# Patient Record
Sex: Female | Born: 2000 | Race: White | Hispanic: No | Marital: Single | Smoking: Never smoker
Health system: Southern US, Community
[De-identification: ages and names within clinical notes are randomized; demographics above are authoritative.]

## PROBLEM LIST (undated history)

## (undated) DIAGNOSIS — R011 Cardiac murmur, unspecified: Secondary | ICD-10-CM

## (undated) DIAGNOSIS — S129XXA Fracture of neck, unspecified, initial encounter: Secondary | ICD-10-CM

## (undated) DIAGNOSIS — S43006A Unspecified dislocation of unspecified shoulder joint, initial encounter: Secondary | ICD-10-CM

## (undated) DIAGNOSIS — S72009A Fracture of unspecified part of neck of unspecified femur, initial encounter for closed fracture: Secondary | ICD-10-CM

## (undated) DIAGNOSIS — M419 Scoliosis, unspecified: Secondary | ICD-10-CM

## (undated) DIAGNOSIS — S62109A Fracture of unspecified carpal bone, unspecified wrist, initial encounter for closed fracture: Secondary | ICD-10-CM

## (undated) DIAGNOSIS — J302 Other seasonal allergic rhinitis: Secondary | ICD-10-CM

## (undated) HISTORY — PX: TONSILLECTOMY: SUR1361

## (undated) HISTORY — PX: KNEE SURGERY: SHX244

## (undated) HISTORY — PX: TONSILLECTOMY AND ADENOIDECTOMY: SUR1326

---
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
## 2018-01-20 DIAGNOSIS — D110 Benign neoplasm of parotid gland: Secondary | ICD-10-CM | POA: Insufficient documentation

## 2018-01-29 ENCOUNTER — Other Ambulatory Visit: Payer: Self-pay

## 2018-01-29 ENCOUNTER — Encounter (HOSPITAL_COMMUNITY): Payer: Self-pay | Admitting: Emergency Medicine

## 2018-01-29 ENCOUNTER — Ambulatory Visit (INDEPENDENT_AMBULATORY_CARE_PROVIDER_SITE_OTHER): Payer: BLUE CROSS/BLUE SHIELD

## 2018-01-29 ENCOUNTER — Ambulatory Visit (HOSPITAL_COMMUNITY)
Admission: EM | Admit: 2018-01-29 | Discharge: 2018-01-29 | Disposition: A | Discharge status: Home / Self Care | Payer: BLUE CROSS/BLUE SHIELD | Attending: Family Medicine | Admitting: Family Medicine

## 2018-01-29 DIAGNOSIS — M25511 Pain in right shoulder: Secondary | ICD-10-CM
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
## 2018-01-29 NOTE — Discharge Instructions (Addendum)
No fracture or dislocation on x-ray.  Leg contusion or muscular strain.  Use anti-inflammatories for pain/swelling. You may take up to 800 mg Ibuprofen every 8 hours with food. You may supplement Ibuprofen with Tylenol [phone] mg every 8 hours.   Please use sling for comfort, do not use all the time as your shoulder may get stiff  Please follow-up with your primary provider or orthopedics if symptoms not improving in 1-2 weeks.

## 2018-01-29 NOTE — ED Provider Notes (Signed)
[place]    CSN: [id number] Arrival date & time: 01/29/18  1033     History   Chief Complaint Chief Complaint  Patient presents with  . Shoulder Pain    HPI Jo Ward is a 17 y.o. female presenting today with right shoulder pain.  States that she was play fighting with her boyfriend on Monday-2 days ago and hit her right shoulder on the corner of the wood frame of a window.  She states that since she has developed a popping, clicking and tingling sensation.  Has also had pain limiting her movement.  She denies any neck pain or difficulty breathing.  She states that she is previously dislocated this shoulder about 4 years ago, but this does not feel like her dislocation.  Denies any loss of sensation distally.  HPI  Past Medical History:  Diagnosis Date  . Heart murmur   . Hip fracture ([place])   . Neck fracture ([place])   . Premature birth   . Seasonal allergies   . Shoulder dislocation   . Wrist fracture     Patient Active Problem List   Diagnosis Date Noted  . Whole body pain 10/22/2017  . Partial Achilles tendon tear, left, sequela 11/05/2016  . Posterior tibial tendonitis, left 11/05/2016  . Sprain of anterior talofibular ligament of left ankle 11/05/2016    Past Surgical History:  Procedure Laterality Date  . KNEE SURGERY     NPFL on left and right and meniscus repair to left knee (total of 3 knee surgeries) per mother and patient  . TONSILLECTOMY    . TONSILLECTOMY AND ADENOIDECTOMY      OB History    No data available       Home Medications    Prior to Admission medications   Medication Sig Start Date End Date Taking? Authorizing Provider  cyclobenzaprine (FLEXERIL) 5 MG tablet Take 1-2 tablets (5-10 mg total) 3 (three) times daily as needed by mouth for muscle spasms. 10/22/17  Yes Leandrew Koyanagi, MD  FLUoxetine (PROZAC) 20 MG capsule Take 20 mg by mouth daily.   Yes [provider]  loratadine (CLARITIN) 10 MG tablet Take 10 mg  by mouth daily.   Yes [provider]  naproxen (NAPROSYN) 500 MG tablet Take 1 tablet (500 mg total) 2 (two) times daily with a meal by mouth. 10/22/17  Yes Leandrew Koyanagi, MD  omeprazole (PRILOSEC) 20 MG capsule Take 20 mg by mouth daily.   Yes [provider]  acetaminophen (TYLENOL) 500 MG tablet Take 1,000 mg by mouth every 6 (six) hours as needed for moderate pain (pain).    [provider]  ibuprofen (ADVIL,MOTRIN) 200 MG tablet Take 400 mg by mouth every 6 (six) hours as needed for moderate pain (pain).    [provider]  omeprazole (PRILOSEC) 40 MG capsule Take 1 capsule (40 mg total) by mouth daily. 02/15/14   Gregor Hams, MD  ondansetron (ZOFRAN) 4 MG tablet Take 4 mg by mouth every 8 (eight) hours as needed for nausea or vomiting.    [provider]    Family History Family History  Problem Relation Age of Onset  . Hypertension Mother     Social History Social History   Tobacco Use  . Smoking status: Never Smoker  . Smokeless tobacco: Never Used  Substance Use Topics  . Alcohol use: No  . Drug use: No     Allergies   Lactose intolerance (gi)  Review of Systems Review of Systems  Respiratory: Negative for shortness of breath.   Cardiovascular: Negative for chest pain.  Gastrointestinal: Negative for nausea and vomiting.  Musculoskeletal: Positive for arthralgias and myalgias.  Skin: Negative for wound.  Neurological: Positive for weakness. Negative for dizziness, light-headedness, numbness and headaches.     Physical Exam Triage Vital Signs ED Triage Vitals  Enc Vitals Group     BP 01/29/18 1131 103/64     Pulse Rate 01/29/18 1131 79     Resp 01/29/18 1131 18     Temp 01/29/18 1131 97.7 F (36.5 C)     Temp src --      SpO2 01/29/18 1131 99 %     Weight --      Height --      Head Circumference --      Peak Flow --      Pain Score 01/29/18 1127 6     Pain Loc --      Pain Edu? --      Excl. in [place]? --      No data found.  Updated Vital Signs BP 103/64 (BP Location: Left Arm)   Pulse 79   Temp 97.7 F (36.5 C)   Resp 18   LMP 01/12/2018   SpO2 99%   Visual Acuity Right Eye Distance:   Left Eye Distance:   Bilateral Distance:    Right Eye Near:   Left Eye Near:    Bilateral Near:     Physical Exam  Constitutional: She appears well-developed and well-nourished. No distress.  HENT:  Head: Normocephalic and atraumatic.  Eyes: Conjunctivae are normal.  Neck: Neck supple.  Cardiovascular: Normal rate.  Pulmonary/Chest: Effort normal. No respiratory distress.  Musculoskeletal: She exhibits no edema.  Right shoulder: No obvious swelling or deformity.  Mild tenderness to palpation of superior aspect of clavicle, tenderness along lower cervical spine, tenderness along scapular spine and supraspinatus muscle.  Tenderness to deltoid and lateral aspect of proximal shoulder.  Pain elicited with lift off, [place], external rotation resistance, did not have any weakness compared to left.  Patient had full range of motion in all directions of the shoulder.  Radial pulse 2+, sensation intact distally on palmar and dorsal surfaces of hand.  Neurological: She is alert.  Skin: Skin is warm and dry.  Psychiatric: She has a normal mood and affect.  Nursing note and vitals reviewed.    UC Treatments / Results  Labs (all labs ordered are listed, but only abnormal results are displayed) Labs Reviewed - No data to display  EKG  EKG Interpretation None       Radiology Dg Shoulder Right  Result Date: 01/29/2018 CLINICAL DATA:  [place] on Monday landing on shoulder, struck the window sill, posterior RIGHT shoulder pain and pain at lateral border of RIGHT scapula radiating to the humeral head region laterally EXAM: RIGHT SHOULDER - 2+ VIEW COMPARISON:  None FINDINGS: Osseous mineralization normal. AC joint alignment normal. No acute fracture, dislocation, or bone destruction. Visualized RIGHT  ribs unremarkable. IMPRESSION: No acute osseous abnormalities. Electronically Signed   By: Lavonia Dana M.D.   On: 01/29/2018 12:34    Procedures Procedures (including critical care time)  Medications Ordered in UC Medications - No data to display   Initial Impression / Assessment and Plan / UC Course  I have reviewed the triage vital signs and the nursing notes.  Pertinent labs & imaging results that were available during my care of the  patient were reviewed by me and considered in my medical decision making (see chart for details).    Negative x-ray. Suspect either shoulder contusion versus muscular strain.  Patient did not have any weakness when testing the rotator cuff although she did have pain.  Will recommend conservative management, follow-up with pediatrician or orthopedics in 1-2 weeks if pain not improving.  NSAIDs, ice. Discussed strict return precautions. Patient verbalized understanding and is agreeable with plan.  Patient with previous compression of T1, endorses minimal neck pain pain is more so on the side of her neck.  Advised her to continue to monitor this for improvement.  Return if not resolving in about 1 week. Final Clinical Impressions(s) / UC Diagnoses   Final diagnoses:  Acute pain of right shoulder    ED Discharge Orders    None       Controlled Substance Prescriptions [place] Controlled Substance Registry consulted? Not Applicable   Janith Lima, [place] 01/29/18 1245

## 2018-01-30 ENCOUNTER — Ambulatory Visit (INDEPENDENT_AMBULATORY_CARE_PROVIDER_SITE_OTHER): Payer: BLUE CROSS/BLUE SHIELD | Admitting: Orthopaedic Surgery

## 2018-02-20 ENCOUNTER — Ambulatory Visit (INDEPENDENT_AMBULATORY_CARE_PROVIDER_SITE_OTHER): Payer: BLUE CROSS/BLUE SHIELD | Admitting: Psychology

## 2018-02-20 ENCOUNTER — Encounter: Payer: Self-pay | Admitting: Family Medicine

## 2018-02-20 DIAGNOSIS — F410 Panic disorder [episodic paroxysmal anxiety] without agoraphobia: Secondary | ICD-10-CM

## 2018-03-06 ENCOUNTER — Ambulatory Visit: Payer: BLUE CROSS/BLUE SHIELD | Admitting: Psychology

## 2018-03-20 ENCOUNTER — Ambulatory Visit: Payer: Self-pay | Admitting: Psychology

## 2018-03-20 DIAGNOSIS — R590 Localized enlarged lymph nodes: Secondary | ICD-10-CM | POA: Insufficient documentation

## 2018-04-03 ENCOUNTER — Ambulatory Visit: Payer: BLUE CROSS/BLUE SHIELD | Admitting: Psychology

## 2018-04-08 ENCOUNTER — Ambulatory Visit: Payer: BLUE CROSS/BLUE SHIELD | Admitting: Psychology

## 2018-04-28 ENCOUNTER — Ambulatory Visit
Admission: RE | Admit: 2018-04-28 | Discharge: 2018-04-28 | Disposition: A | Discharge status: Home / Self Care | Payer: Medicaid Other | Source: Ambulatory Visit | Attending: Family Medicine | Admitting: Family Medicine

## 2018-04-28 ENCOUNTER — Other Ambulatory Visit: Payer: Self-pay | Admitting: Family Medicine

## 2018-04-28 DIAGNOSIS — S0990XA Unspecified injury of head, initial encounter: Secondary | ICD-10-CM

## 2018-07-07 ENCOUNTER — Ambulatory Visit: Payer: BLUE CROSS/BLUE SHIELD | Admitting: Neurology

## 2018-07-09 ENCOUNTER — Encounter: Payer: Self-pay | Admitting: Neurology

## 2018-11-12 ENCOUNTER — Inpatient Hospital Stay (HOSPITAL_COMMUNITY)
Admission: AD | Admit: 2018-11-12 | Discharge: 2018-11-12 | Disposition: A | Discharge status: Home / Self Care | Payer: BLUE CROSS/BLUE SHIELD | Source: Ambulatory Visit | Attending: Obstetrics and Gynecology | Admitting: Obstetrics and Gynecology

## 2018-11-12 ENCOUNTER — Encounter (HOSPITAL_COMMUNITY): Payer: Self-pay | Admitting: *Deleted

## 2018-11-12 ENCOUNTER — Other Ambulatory Visit: Payer: Self-pay

## 2018-11-12 DIAGNOSIS — Z79899 Other long term (current) drug therapy: Secondary | ICD-10-CM | POA: Diagnosis not present

## 2018-11-12 DIAGNOSIS — R1032 Left lower quadrant pain: Secondary | ICD-10-CM | POA: Insufficient documentation

## 2018-11-12 DIAGNOSIS — Z791 Long term (current) use of non-steroidal anti-inflammatories (NSAID): Secondary | ICD-10-CM | POA: Insufficient documentation

## 2018-11-12 DIAGNOSIS — R102 Pelvic and perineal pain: Secondary | ICD-10-CM | POA: Diagnosis present

## 2018-11-12 LAB — URINALYSIS, ROUTINE W REFLEX MICROSCOPIC
Bilirubin Urine: NEGATIVE
Glucose, UA: NEGATIVE mg/dL
KETONES UR: 5 mg/dL — AB
Nitrite: NEGATIVE
PH: 5 (ref 5.0–8.0)
Protein, ur: NEGATIVE mg/dL
SPECIFIC GRAVITY, URINE: 1.015 (ref 1.005–1.030)

## 2018-11-12 LAB — CBC WITH DIFFERENTIAL/PLATELET
BASOS ABS: 0 10*3/uL (ref 0.0–0.1)
Basophils Relative: 0 %
EOS PCT: 2 %
Eosinophils Absolute: 0.2 10*3/uL (ref 0.0–0.5)
HCT: 43.9 % (ref 36.0–46.0)
HEMOGLOBIN: 14.8 g/dL (ref 12.0–15.0)
LYMPHS ABS: 2.8 10*3/uL (ref 0.7–4.0)
LYMPHS PCT: 30 %
MCH: 31.1 pg (ref 26.0–34.0)
MCHC: 33.7 g/dL (ref 30.0–36.0)
MCV: 92.2 fL (ref 80.0–100.0)
Monocytes Absolute: 0.3 10*3/uL (ref 0.1–1.0)
Monocytes Relative: 3 %
NEUTROS ABS: 5.9 10*3/uL (ref 1.7–7.7)
NEUTROS PCT: 65 %
NRBC: 0 % (ref 0.0–0.2)
PLATELETS: 222 10*3/uL (ref 150–400)
RBC: 4.76 MIL/uL (ref 3.87–5.11)
RDW: 12.3 % (ref 11.5–15.5)
WBC: 9.2 10*3/uL (ref 4.0–10.5)

## 2018-11-12 LAB — POCT PREGNANCY, URINE: Preg Test, Ur: NEGATIVE

## 2018-11-12 NOTE — MAU Provider Note (Signed)
History     CSN: [id number]  Arrival date and time: 11/12/18 1056   First Provider Initiated Contact with Patient 11/12/18 1150      Chief Complaint  Patient presents with  . Pelvic Pain   HPI Jo Ward is a 17 y.o. non pregnant female who presents with left lower quadrant pain. She states it started 4 days ago and is intermittent. She rates the pain a 7/10 and took an NSAID with relief. She denies any vaginal bleeding or discharge. She has no period because she is using Depo with her last injection around 8 weeks ago.   OB History   None     Past Medical History:  Diagnosis Date  . Heart murmur   . Hip fracture ([place])   . Neck fracture ([place])   . Premature birth   . Seasonal allergies   . Shoulder dislocation   . Wrist fracture     Past Surgical History:  Procedure Laterality Date  . KNEE SURGERY     NPFL on left and right and meniscus repair to left knee (total of 3 knee surgeries) per mother and patient  . TONSILLECTOMY    . TONSILLECTOMY AND ADENOIDECTOMY      Family History  Problem Relation Age of Onset  . Hypertension Mother     Social History   Tobacco Use  . Smoking status: Never Smoker  . Smokeless tobacco: Never Used  Substance Use Topics  . Alcohol use: No  . Drug use: No    Allergies:  Allergies  Allergen Reactions  . Lactose Intolerance (Gi) Other (See Comments)    REACTION:  GI upset    Medications Prior to Admission  Medication Sig Dispense Refill Last Dose  . acetaminophen (TYLENOL) 500 MG tablet Take 1,000 mg by mouth every 6 (six) hours as needed for moderate pain (pain).   Taking  . cyclobenzaprine (FLEXERIL) 5 MG tablet Take 1-2 tablets (5-10 mg total) 3 (three) times daily as needed by mouth for muscle spasms. 30 tablet 3 Past Month at Unknown time  . FLUoxetine (PROZAC) 20 MG capsule Take 20 mg by mouth daily.   01/29/2018 at Unknown time  . ibuprofen (ADVIL,MOTRIN) 200 MG tablet Take 400 mg by mouth every 6 (six) hours as  needed for moderate pain (pain).   Taking  . loratadine (CLARITIN) 10 MG tablet Take 10 mg by mouth daily.   01/29/2018 at Unknown time  . naproxen (NAPROSYN) 500 MG tablet Take 1 tablet (500 mg total) 2 (two) times daily with a meal by mouth. 30 tablet 3 Past Month at Unknown time  . omeprazole (PRILOSEC) 20 MG capsule Take 20 mg by mouth daily.   01/29/2018 at Unknown time  . omeprazole (PRILOSEC) 40 MG capsule Take 1 capsule (40 mg total) by mouth daily. 30 capsule 1 Taking  . ondansetron (ZOFRAN) 4 MG tablet Take 4 mg by mouth every 8 (eight) hours as needed for nausea or vomiting.   Taking    Review of Systems  Constitutional: Negative.  Negative for fatigue and fever.  HENT: Negative.   Respiratory: Negative.  Negative for shortness of breath.   Cardiovascular: Negative.  Negative for chest pain.  Gastrointestinal: Positive for abdominal pain. Negative for constipation, diarrhea, nausea and vomiting.  Genitourinary: Negative.  Negative for dysuria, vaginal bleeding and vaginal discharge.  Neurological: Negative.  Negative for dizziness and headaches.   Physical Exam   Blood pressure 121/75, pulse 96, temperature 98.5 F (36.9 C),  temperature source Oral, resp. rate 16, weight 57.6 kg, SpO2 100 %.  Physical Exam  Nursing note and vitals reviewed. Constitutional: She is oriented to person, place, and time. She appears well-developed and well-nourished. No distress.  HENT:  Head: Normocephalic.  Eyes: Pupils are equal, round, and reactive to light.  Cardiovascular: Normal rate, regular rhythm and normal heart sounds.  Respiratory: Effort normal and breath sounds normal. No respiratory distress.  GI: Soft. Bowel sounds are normal. She exhibits no distension. There is no tenderness.  Neurological: She is alert and oriented to person, place, and time.  Skin: Skin is warm and dry.  Psychiatric: She has a normal mood and affect. Her behavior is normal. Judgment and thought content  normal.    MAU Course  Procedures Results for orders placed or performed during the hospital encounter of 11/12/18 (from the past 24 hour(s))  Urinalysis, Routine w reflex microscopic     Status: Abnormal   Collection Time: 11/12/18 11:15 AM  Result Value Ref Range   Color, Urine YELLOW YELLOW   APPearance CLOUDY (A) CLEAR   Specific Gravity, Urine 1.015 1.005 - 1.030   pH 5.0 5.0 - 8.0   Glucose, UA NEGATIVE NEGATIVE mg/dL   Hgb urine dipstick SMALL (A) NEGATIVE   Bilirubin Urine NEGATIVE NEGATIVE   Ketones, ur 5 (A) NEGATIVE mg/dL   Protein, ur NEGATIVE NEGATIVE mg/dL   Nitrite NEGATIVE NEGATIVE   Leukocytes, UA SMALL (A) NEGATIVE   RBC / HPF 0-5 0 - 5 RBC/hpf   WBC, UA 0-5 0 - 5 WBC/hpf   Bacteria, UA RARE (A) NONE SEEN   Squamous Epithelial / LPF 21-50 0 - 5   Mucus PRESENT   Pregnancy, urine POC     Status: None   Collection Time: 11/12/18 11:19 AM  Result Value Ref Range   Preg Test, Ur NEGATIVE NEGATIVE  CBC with Differential/Platelet     Status: None   Collection Time: 11/12/18 12:15 PM  Result Value Ref Range   WBC 9.2 4.0 - 10.5 K/uL   RBC 4.76 3.87 - 5.11 MIL/uL   Hemoglobin 14.8 12.0 - 15.0 g/dL   HCT 43.9 36.0 - 46.0 %   MCV 92.2 80.0 - 100.0 fL   MCH 31.1 26.0 - 34.0 pg   MCHC 33.7 30.0 - 36.0 g/dL   RDW 12.3 11.5 - 15.5 %   Platelets 222 150 - 400 K/uL   nRBC 0.0 0.0 - 0.2 %   Neutrophils Relative % 65 %   Neutro Abs 5.9 1.7 - 7.7 K/uL   Lymphocytes Relative 30 %   Lymphs Abs 2.8 0.7 - 4.0 K/uL   Monocytes Relative 3 %   Monocytes Absolute 0.3 0.1 - 1.0 K/uL   Eosinophils Relative 2 %   Eosinophils Absolute 0.2 0.0 - 0.5 K/uL   Basophils Relative 0 %   Basophils Absolute 0.0 0.0 - 0.1 K/uL   MDM UA, UPT CBC with Diff VSS, no nausea, vomiting, diarrhea or constipation. Low suspicion for any acute process at this time. Patient refused pain medication and vaginal swabs  Patient and mother are asking to be evaluated for PCOS and endometriosis.  Explained to patient that this cannot be done in MAU and that we evaluate for emergent conditions. Explained that both of those require follow up in her gyn office.   Assessment and Plan   1. Left lower quadrant abdominal pain    -Discharge home in stable condition -Encouraged patient to use ibuprofen for  pain management -Patient advised to follow-up with [facility] for evaluation of gyn concerns -Patient may return to MAU as needed or if her condition were to change or worsen   Wende Mott CNM 11/12/2018, 11:51 AM

## 2018-11-12 NOTE — Discharge Instructions (Signed)
Endometriosis Endometriosis is a condition in which the tissue that lines the uterus (endometrium) grows outside of its normal location. The tissue may grow in many locations close to the uterus, but it commonly grows on the ovaries, fallopian tubes, vagina, or bowel. When the uterus sheds the endometrium every menstrual cycle, there is bleeding wherever the endometrial tissue is located. This can cause pain because blood is irritating to tissues that are not normally exposed to it. What are the causes? The cause of endometriosis is not known. What increases the risk? You may be more likely to develop endometriosis if you:  Have a family history of endometriosis.  Have never given birth.  Started your period at age 28 or younger.  Have high levels of estrogen in your body.  Were exposed to a certain medicine (diethylstilbestrol) before you were born (in utero).  Had low birth weight.  Were born as a twin, triplet, or other multiple.  Have a BMI of less than 25. BMI is an estimate of body fat and is calculated from height and weight.  What are the signs or symptoms? Often, there are no symptoms of this condition. If you do have symptoms, they may:  Vary depending on where your endometrial tissue is growing.  Occur during your menstrual period (most common) or midcycle.  Come and go, or you may go months with no symptoms at all.  Stop with menopause.  Symptoms may include:  Pain in the back or abdomen.  Heavier bleeding during periods.  Pain during sex.  Painful bowel movements.  Infertility.  Pelvic pain.  Bleeding more than once a month.  How is this diagnosed? This condition is diagnosed based on your symptoms and a physical exam. You may have tests, such as:  Blood tests and urine tests. These may be done to help rule out other possible causes of your symptoms.  Ultrasound, to look for abnormal tissues.  An X-ray of the lower bowel (barium enema).  An  ultrasound that is done through the vagina (transvaginally).  CT scan.  MRI.  Laparoscopy. In this procedure, a lighted, pencil-sized instrument called a laparoscope is inserted into your abdomen through an incision. The laparoscope allows your health care provider to look at the organs inside your body and check for abnormal tissue to confirm the diagnosis. If abnormal tissue is found, your health care provider may remove a small piece of tissue (biopsy) to be examined under a microscope.  How is this treated? Treatment for this condition may include:  Medicines to relieve pain, such as NSAIDs.  Hormone therapy. This involves using artificial (synthetic) hormones to reduce endometrial tissue growth. Your health care provider may recommend using a hormonal form of birth control, or other medicines.  Surgery. This may be done to remove abnormal endometrial tissue. ? In some cases, tissue may be removed using a laparoscope and a laser (laparoscopic laser treatment). ? In severe cases, surgery may be done to remove the fallopian tubes, uterus, and ovaries (hysterectomy).  Follow these instructions at home:  Take over-the-counter and prescription medicines only as told by your health care provider.  Do not drive or use heavy machinery while taking prescription pain medicine.  Try to avoid activities that cause pain, including sexual activity.  Keep all follow-up visits as told by your health care provider. This is important. Contact a health care provider if:  You have pain in the area between your hip bones (pelvic area) that occurs: ? Before, during, or  after your period. ? In between your period and gets worse during your period. ? During or after sex. ? With bowel movements or urination, especially during your period.  You have problems getting pregnant.  You have a fever. Get help right away if:  You have severe pain that does not get better with medicine.  You have severe  nausea and vomiting, or you cannot eat without vomiting.  You have pain that affects only the lower, right side of your abdomen.  You have abdominal pain that gets worse.  You have abdominal swelling.  You have blood in your stool. This information is not intended to replace advice given to you by your health care provider. Make sure you discuss any questions you have with your health care provider. Document Released: 11/30/2000 Document Revised: 09/07/2016 Document Reviewed: 05/05/2016 Elsevier Interactive Patient Education  2018 [place]. Polycystic Ovarian Syndrome Polycystic ovarian syndrome (PCOS) is a common hormonal disorder among women of reproductive age. In most women with PCOS, many small fluid-filled sacs (cysts) grow on the ovaries, and the cysts are not part of a normal menstrual cycle. PCOS can cause problems with your menstrual periods and make it difficult to get pregnant. It can also cause an increased risk of miscarriage with pregnancy. If it is not treated, PCOS can lead to serious health problems, such as diabetes and heart disease. What are the causes? The cause of PCOS is not known, but it may be the result of a combination of certain factors, such as:  Irregular menstrual cycle.  High levels of certain hormones (androgens).  Problems with the hormone that helps to control blood sugar (insulin resistance).  Certain genes.  What increases the risk? This condition is more likely to develop in women who have a family history of PCOS. What are the signs or symptoms? Symptoms of PCOS may include:  Multiple ovarian cysts.  Infrequent periods or no periods.  Periods that are too frequent or too heavy.  Unpredictable periods.  Inability to get pregnant (infertility) because of not ovulating.  Increased growth of hair on the face, chest, stomach, back, thumbs, thighs, or toes.  Acne or oily skin. Acne may develop during adulthood, and it may not respond to  treatment.  Pelvic pain.  Weight gain or obesity.  Patches of thickened and dark brown or black skin on the neck, arms, breasts, or thighs (acanthosis nigricans).  Excess hair growth on the face, chest, abdomen, or upper thighs (hirsutism).  How is this diagnosed? This condition is diagnosed based on:  Your medical history.  A physical exam, including a pelvic exam. Your health care provider may look for areas of increased hair growth on your skin.  Tests, such as: ? Ultrasound. This may be used to examine the ovaries and the lining of the uterus (endometrium) for cysts. ? Blood tests. These may be used to check levels of sugar (glucose), female hormone (testosterone), and female hormones (estrogen and progesterone) in your blood.  How is this treated? There is no cure for PCOS, but treatment can help to manage symptoms and prevent more health problems from developing. Treatment varies depending on:  Your symptoms.  Whether you want to have a baby or whether you need birth control (contraception).  Treatment may include nutrition and lifestyle changes along with:  Progesterone hormone to start a menstrual period.  Birth control pills to help you have regular menstrual periods.  Medicines to make you ovulate, if you want to get pregnant.  Medicine to reduce excessive hair growth.  Surgery, in severe cases. This may involve making small holes in one or both of your ovaries. This decreases the amount of testosterone that your body produces.  Follow these instructions at home:  Take over-the-counter and prescription medicines only as told by your health care provider.  Follow a healthy meal plan. This can help you reduce the effects of PCOS. ? Eat a healthy diet that includes lean proteins, complex carbohydrates, fresh fruits and vegetables, low-fat dairy products, and healthy fats. Make sure to eat enough fiber.  If you are overweight, lose weight as told by your health care  provider. ? Losing 10% of your body weight may improve symptoms. ? Your health care provider can determine how much weight loss is best for you and can help you lose weight safely.  Keep all follow-up visits as told by your health care provider. This is important. Contact a health care provider if:  Your symptoms do not get better with medicine.  You develop new symptoms. This information is not intended to replace advice given to you by your health care provider. Make sure you discuss any questions you have with your health care provider. Document Released: 03/29/2005 Document Revised: 07/31/2016 Document Reviewed: 05/20/2016 Elsevier Interactive Patient Education  [employer].

## 2018-11-12 NOTE — MAU Note (Signed)
Pain in left pelvic area, started about 4 days ago, has noted that left side of abd protrudes more then right.

## 2018-11-14 LAB — URINE CULTURE: Culture: >=100000 — AB

## 2018-12-15 ENCOUNTER — Ambulatory Visit (INDEPENDENT_AMBULATORY_CARE_PROVIDER_SITE_OTHER): Payer: BLUE CROSS/BLUE SHIELD | Admitting: Podiatry

## 2018-12-15 ENCOUNTER — Ambulatory Visit (INDEPENDENT_AMBULATORY_CARE_PROVIDER_SITE_OTHER): Payer: BLUE CROSS/BLUE SHIELD

## 2018-12-15 DIAGNOSIS — M25372 Other instability, left ankle: Secondary | ICD-10-CM | POA: Diagnosis not present

## 2018-12-15 DIAGNOSIS — M7662 Achilles tendinitis, left leg: Secondary | ICD-10-CM | POA: Diagnosis not present

## 2018-12-15 DIAGNOSIS — M79672 Pain in left foot: Secondary | ICD-10-CM

## 2018-12-15 DIAGNOSIS — M659 Synovitis and tenosynovitis, unspecified: Secondary | ICD-10-CM

## 2018-12-20 NOTE — Progress Notes (Signed)
   HPI: 18 year old female presents the office today as a new patient for evaluation of left ankle pain that is been going on for approximately 2 years.  Patient rolled her left ankle approximately 2 years ago and felt a sharp pulling sensation.  Patient complains that her left foot is now turning inside she is lost some sensation throughout the entire left foot resulting from nerve damage caused by left knee surgery in the past.  Patient continues to have chronic ankle pain for the past 2 years despite multiple conservative treatments including immobilization, steroid oral injections.  She presents for follow-up treatment evaluation  Past Medical History:  Diagnosis Date  . Heart murmur   . Hip fracture ([place])   . Neck fracture ([place])   . Premature birth   . Seasonal allergies   . Shoulder dislocation   . Wrist fracture      Physical Exam: General: The patient is alert and oriented x3 in no acute distress.  Dermatology: Skin is warm, dry and supple bilateral lower extremities. Negative for open lesions or macerations.  Vascular: Palpable pedal pulses bilaterally. No edema or erythema noted. Capillary refill within normal limits.  Neurological: Epicritic and protective threshold grossly intact bilaterally.   Musculoskeletal Exam: Range of motion within normal limits to all pedal and ankle joints bilateral. Muscle strength 5/5 in all groups bilateral.  Positive anterior drawer sign consistent with possible ATFL rupture.  There is also some peroneal subluxation with audible popping with dorsiflexion and eversion of the foot.  There is pain on palpation also along the Achilles tendon left lower extremity.  Radiographic Exam:  Normal osseous mineralization. Joint spaces preserved. No fracture/dislocation/boney destruction.    Assessment: 1.  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  Peroneal subluxation left 3.  Achilles tendinitis left   Plan of Care:  1. Patient evaluated. X-Rays  reviewed.  2.  Today were going to order an MRI left ankle to rule out all possible pathology.  Patient is had this pain for greater than 2 years secondary to an injury and conservative treatments have been unsuccessful for 2 years in providing any sort of lasting alleviation of symptoms 3.  Return to clinic in 4 weeks  *Patient is a Scientist, water quality at [employer]      Edrick Kins, DPM [facility]  Dr. Edrick Kins, DPM    [address], [place] [address]                Office [phone]  Fax [phone]

## 2018-12-22 ENCOUNTER — Telehealth: Payer: Self-pay | Admitting: *Deleted

## 2018-12-22 ENCOUNTER — Other Ambulatory Visit: Payer: Self-pay | Admitting: Podiatry

## 2018-12-22 DIAGNOSIS — M25372 Other instability, left ankle: Secondary | ICD-10-CM

## 2018-12-22 DIAGNOSIS — M7662 Achilles tendinitis, left leg: Secondary | ICD-10-CM

## 2018-12-22 NOTE — Telephone Encounter (Signed)
Orders to J. Quintana, RN for pre-cert and faxed to [facility]. 

## 2018-12-22 NOTE — Telephone Encounter (Signed)
-----   Message from Edrick Kins, DPM sent at 12/20/2018  1:47 PM EST ----- Regarding: MRI left ankle Please order MRI left ankle without contrast  Dx: Possible chronic ATFL rupture.  History of severe ankle sprain x2 years  Note dictated  Thanks, Dr. Amalia Hailey

## 2018-12-25 ENCOUNTER — Ambulatory Visit
Admission: RE | Admit: 2018-12-25 | Discharge: 2018-12-25 | Disposition: A | Discharge status: Home / Self Care | Payer: BLUE CROSS/BLUE SHIELD | Source: Ambulatory Visit | Attending: Podiatry | Admitting: Podiatry

## 2019-01-12 ENCOUNTER — Encounter: Payer: Self-pay | Admitting: Podiatry

## 2019-01-12 ENCOUNTER — Ambulatory Visit (INDEPENDENT_AMBULATORY_CARE_PROVIDER_SITE_OTHER): Payer: BLUE CROSS/BLUE SHIELD | Admitting: Podiatry

## 2019-01-12 DIAGNOSIS — S9305XD Dislocation of left ankle joint, subsequent encounter: Secondary | ICD-10-CM

## 2019-01-12 DIAGNOSIS — M659 Synovitis and tenosynovitis, unspecified: Secondary | ICD-10-CM

## 2019-01-12 DIAGNOSIS — IMO0001 Reserved for inherently not codable concepts without codable children: Secondary | ICD-10-CM

## 2019-01-18 NOTE — Progress Notes (Signed)
   HPI: 18 year old female presents to the office today for follow up evaluation of left foot and ankle pain. She reports continued pain that has not changed since her previous visit. Walking increases the pain. She has been using the ankle brace for treatment. Patient is here for further evaluation and treatment.   Past Medical History:  Diagnosis Date  . Heart murmur   . Hip fracture ([place])   . Neck fracture ([place])   . Premature birth   . Seasonal allergies   . Shoulder dislocation   . Wrist fracture      Physical Exam: General: The patient is alert and oriented x3 in no acute distress.  Dermatology: Skin is warm, dry and supple bilateral lower extremities. Negative for open lesions or macerations.  Vascular: Palpable pedal pulses bilaterally. No edema or erythema noted. Capillary refill within normal limits.  Neurological: Epicritic and protective threshold grossly intact bilaterally.   Musculoskeletal Exam: Range of motion within normal limits to all pedal and ankle joints bilateral. Muscle strength 5/5 in all groups bilateral.  Positive anterior drawer sign consistent with possible ATFL rupture.  There is also some peroneal subluxation with audible popping with dorsiflexion and eversion of the foot.  There is pain on palpation also along the Achilles tendon left lower extremity.  MRI Impression:  1. Subtle attenuation of the anterior talofibular ligament suggesting low-grade sprain or remote prior injury. No local synovitis to suggest anterolateral impingement. No complete discontinuity of the ATFL. 2. Mild subcortical marrow edema along the talar side of the posterior subtalar joint suggesting low-grade arthropathy. No fluid in the sinus tarsi. 3. Mildly accentuated distal tibialis posterior signal suggesting mild tendinopathy-correlate clinically in assessing for tibialis posterior dysfunction.  Assessment: 1.  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  Peroneal subluxation left 3.   Achilles tendinitis left   Plan of Care:  1. Patient evaluated. MRI reviewed.  2. Injection of 0.5 mLs Celestone Soluspan injected into the left ankle joint.  3. Continue using ankle brace.  4. Physical therapy ordered for twice weekly for 4 weeks.  5. Appointment with Liliane Channel, Pedorthist, for custom molded orthotics.  6. Return to clinic in 6 weeks.   *Patient is a Scientist, water quality at [employer]      Edrick Kins, DPM [facility]  Dr. Edrick Kins, DPM    [address], [place] [address]                Office [phone]  Fax [phone]

## 2019-01-27 ENCOUNTER — Other Ambulatory Visit: Payer: Self-pay | Admitting: Podiatry

## 2019-01-27 DIAGNOSIS — M659 Synovitis and tenosynovitis, unspecified: Secondary | ICD-10-CM

## 2019-02-02 ENCOUNTER — Ambulatory Visit (INDEPENDENT_AMBULATORY_CARE_PROVIDER_SITE_OTHER): Payer: BLUE CROSS/BLUE SHIELD | Admitting: Orthotics

## 2019-02-02 DIAGNOSIS — M659 Synovitis and tenosynovitis, unspecified: Secondary | ICD-10-CM

## 2019-02-02 DIAGNOSIS — IMO0001 Reserved for inherently not codable concepts without codable children: Secondary | ICD-10-CM

## 2019-02-02 DIAGNOSIS — M25372 Other instability, left ankle: Secondary | ICD-10-CM

## 2019-02-02 DIAGNOSIS — S9305XD Dislocation of left ankle joint, subsequent encounter: Secondary | ICD-10-CM

## 2019-02-02 NOTE — Progress Notes (Signed)
Patient came into today to be cast for Custom Foot Orthotics. Upon recommendation of Dr. Amalia Hailey  Patient presents with achilles tendonitis Left and syovitis; patient is also pes planus and RF everted. Goals are Rear foot stability Plan vendor [employer]

## 2019-02-23 ENCOUNTER — Encounter: Payer: BLUE CROSS/BLUE SHIELD | Admitting: Podiatry

## 2019-02-27 NOTE — Progress Notes (Signed)
This encounter was created in error - please disregard.

## 2019-03-07 ENCOUNTER — Ambulatory Visit
Admission: EM | Admit: 2019-03-07 | Discharge: 2019-03-07 | Disposition: A | Discharge status: Home / Self Care | Payer: BLUE CROSS/BLUE SHIELD | Attending: Physician Assistant | Admitting: Physician Assistant

## 2019-03-07 ENCOUNTER — Encounter: Payer: Self-pay | Admitting: Physician Assistant

## 2019-03-07 ENCOUNTER — Other Ambulatory Visit: Payer: Self-pay

## 2019-03-07 DIAGNOSIS — B349 Viral infection, unspecified: Secondary | ICD-10-CM

## 2019-03-07 MED ORDER — IPRATROPIUM BROMIDE 0.06 % NA SOLN: 2.0000 | Freq: Four times a day (QID) | NASAL | 12 refills | Status: DC

## 2019-03-07 MED ORDER — BENZONATATE 200 MG PO CAPS: 200.0000 mg | ORAL_CAPSULE | Freq: Three times a day (TID) | ORAL | 0 refills | Status: DC

## 2019-03-07 MED ORDER — FLUTICASONE PROPIONATE 50 MCG/ACT NA SUSP: 2.0000 | Freq: Every day | NASAL | 0 refills | Status: DC

## 2019-03-07 NOTE — Discharge Instructions (Signed)
Tessalon for cough. Start flonase, atrovent nasal spray for nasal congestion/drainage. You can use over the counter nasal saline rinse such as neti pot for nasal congestion. Keep hydrated, your urine should be clear to pale yellow in color. Tylenol/motrin for fever and pain. Monitor for any worsening of symptoms, chest pain, shortness of breath, wheezing, swelling of the throat, follow up for reevaluation.   For sore throat/cough try using a honey-based tea. Use 3 teaspoons of honey with juice squeezed from half lemon. Place shaved pieces of ginger into 1/2-1 cup of water and warm over stove top. Then mix the ingredients and repeat every 4 hours as needed.

## 2019-03-07 NOTE — ED Triage Notes (Signed)
Per pt one week ago pt was seen at dr and said negative for flu but with her symptoms was dx with the flu. Pt has  Been taking otc medication with no relief. Pt has said fever has subsided. No fever today. Cough, congestion, body aches, dark mucus.

## 2019-03-07 NOTE — ED Provider Notes (Signed)
[name]    CSN: [id number] Arrival date & time: 03/07/19  1054     History   Chief Complaint Chief Complaint  Patient presents with  . Cough  . Nasal Congestion    HPI Jo Ward is a 18 y.o. female.   18 year old female comes in for 1 week history of URI symptoms.  States symptoms started flulike with weakness, body aches, fever, cough, rhinorrhea, nasal congestion.  T-max 101, this resolved about 5 days ago.  At the time, she saw a provider, with negative flu testing, but patient states was told she most likely had the flu.  She was told to take over-the-counter medication, which she has continued DayQuil and NyQuil.  She states given symptoms has not improved, wanted to be evaluated to make sure she can go back to work.  She states she feels short of breath when she is coughing really hard, but otherwise no shortness of breath or wheezing.  No recent travels.  No obvious sick contact.      Past Medical History:  Diagnosis Date  . Heart murmur   . Hip fracture ([place])   . Neck fracture ([place])   . Premature birth   . Seasonal allergies   . Shoulder dislocation   . Wrist fracture     Patient Active Problem List   Diagnosis Date Noted  . Cervical lymphadenopathy 03/20/2018  . 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 Whole body pain 10/22/2017  . Partial Achilles tendon tear, left, sequela 11/05/2016  . Posterior tibial tendonitis, left 11/05/2016  . Sprain of anterior talofibular ligament of left ankle 11/05/2016  . Left knee pain 09/27/2015  . Other specified postprocedural states 03/24/2015  . 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 KNEE SURGERY     NPFL on left and right and meniscus repair to left knee (total of 3 knee surgeries) per mother and patient  . TONSILLECTOMY    . TONSILLECTOMY AND ADENOIDECTOMY      OB History   No obstetric history on file.      Home  Medications    Prior to Admission medications   Medication Sig Start Date End Date Taking? Authorizing Provider  acetaminophen (TYLENOL) 500 MG tablet Take 1,000 mg by mouth every 6 (six) hours as needed for moderate pain (pain).    [provider]  benzonatate (TESSALON) 200 MG capsule Take 1 capsule (200 mg total) by mouth every 8 (eight) hours. 03/07/19   Tasia Catchings, Callin Ashe V, PA-C  cyclobenzaprine (FLEXERIL) 5 MG tablet Take 1-2 tablets (5-10 mg total) 3 (three) times daily as needed by mouth for muscle spasms. 10/22/17   Leandrew Koyanagi, MD  FLUoxetine (PROZAC) 20 MG capsule Take 20 mg by mouth daily.    [provider]  fluticasone (FLONASE) 50 MCG/ACT nasal spray Place 2 sprays into both nostrils daily. 03/07/19   Tasia Catchings, Kamarius Buckbee V, PA-C  ibuprofen (ADVIL,MOTRIN) 200 MG tablet Take 400 mg by mouth every 6 (six) hours as needed for moderate pain (pain).    [provider]  ipratropium (ATROVENT) 0.06 % nasal spray Place 2 sprays into both nostrils 4 (four) times daily. 03/07/19   Tasia Catchings, Zitlaly Malson V, PA-C  loratadine (CLARITIN) 10 MG tablet Take 10 mg by mouth daily.    [provider]  medroxyPROGESTERone (DEPO-PROVERA) 150 MG/ML injection  02/10/18   [provider]  meloxicam (MOBIC) 7.5 MG tablet Take  by mouth. 09/22/18   [provider]  naproxen (NAPROSYN) 500 MG tablet Take 1 tablet (500 mg total) 2 (two) times daily with a meal by mouth. 10/22/17   Leandrew Koyanagi, MD  omeprazole (PRILOSEC) 20 MG capsule Take 20 mg by mouth daily.    [provider]  omeprazole (PRILOSEC) 40 MG capsule Take 1 capsule (40 mg total) by mouth daily. 02/15/14   Gregor Hams, MD  ondansetron (ZOFRAN) 4 MG tablet Take 4 mg by mouth every 8 (eight) hours as needed for nausea or vomiting.    [provider]  promethazine (PHENERGAN) 12.5 MG tablet  02/10/18   [provider]  traZODone (DESYREL) 50 MG tablet 1-3 tabs q hs 09/22/18   [provider]    Family  History Family History  Problem Relation Age of Onset  . Hypertension Mother     Social History Social History   Tobacco Use  . Smoking status: Never Smoker  . Smokeless tobacco: Never Used  Substance Use Topics  . Alcohol use: No  . Drug use: No     Allergies   Lactose intolerance (gi) and Other   Review of Systems Review of Systems  Reason unable to perform ROS: See HPI as above.     Physical Exam Triage Vital Signs ED Triage Vitals  Enc Vitals Group     BP 03/07/19 1111 117/80     Pulse Rate 03/07/19 1111 89     Resp 03/07/19 1111 16     Temp 03/07/19 1111 98 F (36.7 C)     Temp Source 03/07/19 1111 Oral     SpO2 03/07/19 1111 98 %     Weight 03/07/19 1110 130 lb (59 kg)     Height 03/07/19 1110 5' (1.524 m)     Head Circumference --      Peak Flow --      Pain Score 03/07/19 1110 5     Pain Loc --      Pain Edu? --      Excl. in [place]? --    No data found.  Updated Vital Signs BP 117/80 (BP Location: Right Arm)   Pulse 89   Temp 98 F (36.7 C) (Oral)   Resp 16   Ht 5' (1.524 m)   Wt 130 lb (59 kg)   SpO2 98%   BMI 25.39 kg/m   Physical Exam Constitutional:      General: She is not in acute distress.    Appearance: She is well-developed. She is not ill-appearing, toxic-appearing or diaphoretic.  HENT:     Head: Normocephalic and atraumatic.     Right Ear: Tympanic membrane, ear canal and external ear normal. Tympanic membrane is not erythematous or bulging.     Left Ear: Tympanic membrane, ear canal and external ear normal. Tympanic membrane is not erythematous or bulging.     Nose: Rhinorrhea present.     Right Sinus: No maxillary sinus tenderness or frontal sinus tenderness.     Left Sinus: No maxillary sinus tenderness or frontal sinus tenderness.     Mouth/Throat:     Mouth: Mucous membranes are moist.     Pharynx: Oropharynx is clear. Uvula midline.  Eyes:     Conjunctiva/sclera: Conjunctivae normal.     Pupils: Pupils are equal,  round, and reactive to light.  Neck:     Musculoskeletal: Normal range of motion and neck supple.  Cardiovascular:     Rate and Rhythm:  Normal rate and regular rhythm.     Heart sounds: Normal heart sounds. No murmur. No friction rub. No gallop.   Pulmonary:     Effort: Pulmonary effort is normal. No accessory muscle usage, prolonged expiration, respiratory distress or retractions.     Breath sounds: Normal breath sounds. No stridor, decreased air movement or transmitted upper airway sounds. No decreased breath sounds, wheezing, rhonchi or rales.  Skin:    General: Skin is warm and dry.  Neurological:     Mental Status: She is alert and oriented to person, place, and time.    UC Treatments / Results  Labs (all labs ordered are listed, but only abnormal results are displayed) Labs Reviewed - No data to display  EKG None  Radiology No results found.  Procedures Procedures (including critical care time)  Medications Ordered in UC Medications - No data to display  Initial Impression / Assessment and Plan / UC Course  I have reviewed the triage vital signs and the nursing notes.  Pertinent labs & imaging results that were available during my care of the patient were reviewed by me and considered in my medical decision making (see chart for details).    No alarming signs on exam. Lungs clear to auscultation without adventitious lung sounds. Discussed with patient cannot rule out COVID. Provided symptomatic treatment and education of isolation. Return precautions given. Patient expresses understanding and agrees to plan.  Final Clinical Impressions(s) / UC Diagnoses   Final diagnoses:  Viral illness   ED Prescriptions    Medication Sig Dispense Auth. Provider   fluticasone (FLONASE) 50 MCG/ACT nasal spray Place 2 sprays into both nostrils daily. 1 g Elvert Cumpton V, PA-C   ipratropium (ATROVENT) 0.06 % nasal spray Place 2 sprays into both nostrils 4 (four) times daily. 15 mL Geovannie Vilar,  Natasia Sanko V, PA-C   benzonatate (TESSALON) 200 MG capsule Take 1 capsule (200 mg total) by mouth every 8 (eight) hours. 21 capsule Tobin Chad, [place] 03/07/19 1229

## 2019-05-13 ENCOUNTER — Ambulatory Visit: Payer: BLUE CROSS/BLUE SHIELD | Admitting: Podiatry

## 2020-04-25 ENCOUNTER — Ambulatory Visit
Admission: EM | Admit: 2020-04-25 | Discharge: 2020-04-25 | Disposition: A | Discharge status: Home / Self Care | Payer: BLUE CROSS/BLUE SHIELD

## 2020-04-25 DIAGNOSIS — M545 Low back pain, unspecified: Secondary | ICD-10-CM

## 2020-04-25 HISTORY — DX: Scoliosis, unspecified: M41.9

## 2020-04-25 MED ORDER — TIZANIDINE HCL 2 MG PO TABS: 2.0000 mg | ORAL_TABLET | Freq: Three times a day (TID) | ORAL | 0 refills | Status: DC | PRN

## 2020-04-25 MED ORDER — DICLOFENAC SODIUM 50 MG PO TBEC: 50.0000 mg | DELAYED_RELEASE_TABLET | Freq: Two times a day (BID) | ORAL | 0 refills | Status: DC

## 2020-04-25 NOTE — Discharge Instructions (Signed)
Start diclofenac. Do not take ibuprofen (motrin/advil)/ naproxen (aleve) while on diclofenac. Tizanidine as needed, this can make you drowsy, so do not take if you are going to drive, operate heavy machinery, or make important decisions. Ice/heat compresses as needed. This can take up to 3-4 weeks to completely resolve, but you should be feeling better each week. Follow up with PCP/orthopedics if symptoms worsen, changes for reevaluation. If experience numbness/tingling of the inner thighs, loss of bladder or bowel control, go to the emergency department for evaluation.

## 2020-04-25 NOTE — ED Provider Notes (Signed)
[name]    CSN: [id number] Arrival date & time: 04/25/20  1703      History   Chief Complaint Chief Complaint  Patient presents with  . Back Pain    HPI Jo Ward is a 19 y.o. female.   19 year old female comes in for left back pain that started yesterday.  Denies injury or trauma.  Pain is to the left lower back, and can radiate to the posterior leg.  Worse with range of motion.  Denies saddle anesthesia, loss of bladder or bowel control.  Denies urinary changes.  Took gabapentin without relief.  Aleve to 20 mg with some relief.     Past Medical History:  Diagnosis Date  . Heart murmur   . Hip fracture ([place])   . Neck fracture ([place])   . Premature birth   . Scoliosis   . Seasonal allergies   . Shoulder dislocation   . Wrist fracture     Patient Active Problem List   Diagnosis Date Noted  . Cervical lymphadenopathy 03/20/2018  . 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 Whole body pain 10/22/2017  . Partial Achilles tendon tear, left, sequela 11/05/2016  . Posterior tibial tendonitis, left 11/05/2016  . Sprain of anterior talofibular ligament of left ankle 11/05/2016  . Left knee pain 09/27/2015  . Other specified postprocedural states 03/24/2015  . 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 KNEE SURGERY     NPFL on left and right and meniscus repair to left knee (total of 3 knee surgeries) per mother and patient  . TONSILLECTOMY    . TONSILLECTOMY AND ADENOIDECTOMY      OB History   No obstetric history on file.      Home Medications    Prior to Admission medications   Medication Sig Start Date End Date Taking? Authorizing Provider  gabapentin (NEURONTIN) 300 MG capsule Take 300 mg by mouth 3 (three) times daily.   Yes [provider]  acetaminophen (TYLENOL) 500 MG tablet Take 1,000 mg by mouth every 6 (six) hours as needed for moderate pain (pain).     [provider]  diclofenac (VOLTAREN) 50 MG EC tablet Take 1 tablet (50 mg total) by mouth 2 (two) times daily. 04/25/20   Tasia Catchings, Mahati Vajda V, PA-C  medroxyPROGESTERone (DEPO-PROVERA) 150 MG/ML injection  02/10/18   [provider]  tiZANidine (ZANAFLEX) 2 MG tablet Take 1 tablet (2 mg total) by mouth every 8 (eight) hours as needed for muscle spasms. 04/25/20   Ok Edwards, PA-C  promethazine (PHENERGAN) 12.5 MG tablet  02/10/18 04/25/20  [provider]    Family History Family History  Problem Relation Age of Onset  . Hypertension Mother     Social History Social History   Tobacco Use  . Smoking status: Never Smoker  . Smokeless tobacco: Never Used  Substance Use Topics  . Alcohol use: No  . Drug use: No     Allergies   Lactose intolerance (gi) and Other   Review of Systems Review of Systems  Reason unable to perform ROS: See HPI as above.     Physical Exam Triage Vital Signs ED Triage Vitals  Enc Vitals Group     BP 04/25/20 1716 121/72     Pulse Rate 04/25/20 1716 95     Resp 04/25/20 1716 16     Temp 04/25/20 1716 98.6 F (37 C)  Temp Source 04/25/20 1716 Oral     SpO2 04/25/20 1716 98 %     Weight --      Height --      Head Circumference --      Peak Flow --      Pain Score 04/25/20 1723 6     Pain Loc --      Pain Edu? --      Excl. in [place]? --    No data found.  Updated Vital Signs BP 121/72 (BP Location: Left Arm)   Pulse 95   Temp 98.6 F (37 C) (Oral)   Resp 16   SpO2 98%   Physical Exam Constitutional:      General: She is not in acute distress.    Appearance: She is well-developed. She is not diaphoretic.  HENT:     Head: Normocephalic and atraumatic.  Eyes:     Conjunctiva/sclera: Conjunctivae normal.     Pupils: Pupils are equal, round, and reactive to light.  Cardiovascular:     Rate and Rhythm: Normal rate and regular rhythm.     Heart sounds: Normal heart sounds. No murmur. No friction rub. No gallop.     Pulmonary:     Effort: Pulmonary effort is normal. No accessory muscle usage or respiratory distress.     Breath sounds: Normal breath sounds. No stridor. No decreased breath sounds, wheezing, rhonchi or rales.  Musculoskeletal:     Comments: No tenderness on palpation of the spinous processes. Tenderness to palpation of left lower back/hip. Decreased flexion of back. Decreased active ROM of hip. Full passive ROM of hip. Strength deferred due to pain. Sensation intact. Negative straight leg raise.  Skin:    General: Skin is warm and dry.  Neurological:     Mental Status: She is alert and oriented to person, place, and time.    UC Treatments / Results  Labs (all labs ordered are listed, but only abnormal results are displayed) Labs Reviewed - No data to display  EKG   Radiology No results found.  Procedures Procedures (including critical care time)  Medications Ordered in UC Medications - No data to display  Initial Impression / Assessment and Plan / UC Course  I have reviewed the triage vital signs and the nursing notes.  Pertinent labs & imaging results that were available during my care of the patient were reviewed by me and considered in my medical decision making (see chart for details).    Start NSAID as directed. Muscle relaxant as needed. Ice/heat compresses. Discussed with patient this can take up to 3-4 weeks to resolve, but should be getting better each week. Return precautions given.   Final Clinical Impressions(s) / UC Diagnoses   Final diagnoses:  Acute left-sided low back pain without sciatica    ED Prescriptions    Medication Sig Dispense Auth. Provider   diclofenac (VOLTAREN) 50 MG EC tablet Take 1 tablet (50 mg total) by mouth 2 (two) times daily. 20 tablet Marieme Mcmackin V, PA-C   tiZANidine (ZANAFLEX) 2 MG tablet Take 1 tablet (2 mg total) by mouth every 8 (eight) hours as needed for muscle spasms. 15 tablet Ok Edwards, PA-C     PDMP not reviewed this  encounter.   Ok Edwards, PA-C 04/25/20 1856

## 2020-04-25 NOTE — ED Triage Notes (Signed)
Pt c/o lower back pain radiating down lt leg since yesterday, denies injury.

## 2020-07-20 ENCOUNTER — Other Ambulatory Visit: Payer: Self-pay

## 2020-07-20 ENCOUNTER — Ambulatory Visit
Admission: EM | Admit: 2020-07-20 | Discharge: 2020-07-20 | Discharge status: Absent without leave | Payer: BC Managed Care – PPO

## 2020-07-20 NOTE — Discharge Instructions (Signed)
e

## 2020-08-07 ENCOUNTER — Other Ambulatory Visit: Payer: Self-pay

## 2020-08-07 ENCOUNTER — Encounter (HOSPITAL_COMMUNITY): Payer: Self-pay

## 2020-08-07 ENCOUNTER — Emergency Department (HOSPITAL_COMMUNITY)
Admission: EM | Admit: 2020-08-07 | Discharge: 2020-08-07 | Disposition: A | Discharge status: Home / Self Care | Payer: BC Managed Care – PPO | Attending: Emergency Medicine | Admitting: Emergency Medicine

## 2020-08-07 DIAGNOSIS — Z5321 Procedure and treatment not carried out due to patient leaving prior to being seen by health care provider: Secondary | ICD-10-CM | POA: Diagnosis not present

## 2020-08-07 DIAGNOSIS — R1032 Left lower quadrant pain: Secondary | ICD-10-CM | POA: Insufficient documentation

## 2020-08-07 LAB — COMPREHENSIVE METABOLIC PANEL
ALT: 21 U/L (ref 0–44)
AST: 23 U/L (ref 15–41)
Albumin: 4.2 g/dL (ref 3.5–5.0)
Alkaline Phosphatase: 67 U/L (ref 38–126)
Anion gap: 9 (ref 5–15)
BUN: 11 mg/dL (ref 6–20)
CO2: 21 mmol/L — ABNORMAL LOW (ref 22–32)
Calcium: 9.4 mg/dL (ref 8.9–10.3)
Chloride: 109 mmol/L (ref 98–111)
Creatinine, Ser: 0.79 mg/dL (ref 0.44–1.00)
GFR calc Af Amer: >60 mL/min (ref >60)
GFR calc non Af Amer: >60 mL/min (ref >60)
Glucose, Bld: 97 mg/dL (ref 70–99)
Potassium: 3.9 mmol/L (ref 3.5–5.1)
Sodium: 139 mmol/L (ref 135–145)
Total Bilirubin: 0.5 mg/dL (ref 0.3–1.2)
Total Protein: 7 g/dL (ref 6.5–8.1)

## 2020-08-07 LAB — URINALYSIS, ROUTINE W REFLEX MICROSCOPIC
Bacteria, UA: NONE SEEN
Bilirubin Urine: NEGATIVE
Glucose, UA: NEGATIVE mg/dL
Hgb urine dipstick: NEGATIVE
Ketones, ur: NEGATIVE mg/dL
Leukocytes,Ua: NEGATIVE
Nitrite: NEGATIVE
Protein, ur: 30 mg/dL — AB
Specific Gravity, Urine: 1.029 (ref 1.005–1.030)
pH: 5 (ref 5.0–8.0)

## 2020-08-07 LAB — CBC
HCT: 42.8 % (ref 36.0–46.0)
Hemoglobin: 14 g/dL (ref 12.0–15.0)
MCH: 30.1 pg (ref 26.0–34.0)
MCHC: 32.7 g/dL (ref 30.0–36.0)
MCV: 92 fL (ref 80.0–100.0)
Platelets: 212 10*3/uL (ref 150–400)
RBC: 4.65 MIL/uL (ref 3.87–5.11)
RDW: 12 % (ref 11.5–15.5)
WBC: 7.2 10*3/uL (ref 4.0–10.5)
nRBC: 0 % (ref 0.0–0.2)

## 2020-08-07 LAB — I-STAT BETA HCG BLOOD, ED (MC, WL, AP ONLY): I-stat hCG, quantitative: <5 m[IU]/mL (ref <5)

## 2020-08-07 LAB — LIPASE, BLOOD: Lipase: 25 U/L (ref 11–51)

## 2020-08-07 NOTE — ED Notes (Signed)
Pt stated she was leaving and going to urgent care °

## 2020-08-07 NOTE — ED Triage Notes (Signed)
Patient complains of left lower abdominal pain x 2 days, states that the pain is worse with ambulation. No hx of ovary issues but reports cyst runs in family, NAD

## 2021-01-08 ENCOUNTER — Other Ambulatory Visit: Payer: Self-pay

## 2021-01-08 ENCOUNTER — Ambulatory Visit
Admission: EM | Admit: 2021-01-08 | Discharge: 2021-01-08 | Disposition: A | Discharge status: Home / Self Care | Payer: BC Managed Care – PPO | Attending: Urgent Care | Admitting: Urgent Care

## 2021-01-08 ENCOUNTER — Encounter: Payer: Self-pay | Admitting: Emergency Medicine

## 2021-01-08 DIAGNOSIS — R059 Cough, unspecified: Secondary | ICD-10-CM

## 2021-01-08 DIAGNOSIS — B349 Viral infection, unspecified: Secondary | ICD-10-CM

## 2021-01-08 DIAGNOSIS — Z20822 Contact with and (suspected) exposure to covid-19: Secondary | ICD-10-CM

## 2021-01-08 DIAGNOSIS — R058 Other specified cough: Secondary | ICD-10-CM

## 2021-01-08 MED ORDER — PSEUDOEPHEDRINE HCL 60 MG PO TABS: 60.0000 mg | ORAL_TABLET | Freq: Three times a day (TID) | ORAL | 0 refills | Status: DC | PRN

## 2021-01-08 MED ORDER — PROMETHAZINE-DM 6.25-15 MG/5ML PO SYRP: 5.0000 mL | ORAL_SOLUTION | Freq: Every evening | ORAL | 0 refills | Status: DC | PRN

## 2021-01-08 MED ORDER — BENZONATATE 100 MG PO CAPS: 100.0000 mg | ORAL_CAPSULE | Freq: Three times a day (TID) | ORAL | 0 refills | Status: DC | PRN

## 2021-01-08 MED ORDER — CETIRIZINE HCL 10 MG PO TABS: 10.0000 mg | ORAL_TABLET | Freq: Every day | ORAL | 0 refills | Status: DC

## 2021-01-08 NOTE — ED Provider Notes (Signed)
[place]   MRN: [id number] DOB: 2001/02/23  Subjective:   Jo Ward is a 20 y.o. female presenting for 3-day history of acute onset productive cough, noticed some tinges of blood in the mucus today and wanted to get checked out.  She is also had scratchy throat, sinus congestion.  Patient is working on quitting smoking.  Denies history of lung disorders otherwise.  She is vaccinated for COVID 19.  Multiple sick contacts at work this past month.  No current facility-administered medications for this encounter.  Current Outpatient Medications:  .  acetaminophen (TYLENOL) 500 MG tablet, Take 1,000 mg by mouth every 6 (six) hours as needed for moderate pain (pain)., Disp: , Rfl:  .  diclofenac (VOLTAREN) 50 MG EC tablet, Take 1 tablet (50 mg total) by mouth 2 (two) times daily. (Patient not taking: Reported on 01/08/2021), Disp: 20 tablet, Rfl: 0 .  gabapentin (NEURONTIN) 300 MG capsule, Take 300 mg by mouth 3 (three) times daily. (Patient not taking: Reported on 01/08/2021), Disp: , Rfl:  .  medroxyPROGESTERone (DEPO-PROVERA) 150 MG/ML injection, , Disp: , Rfl:  .  tiZANidine (ZANAFLEX) 2 MG tablet, Take 1 tablet (2 mg total) by mouth every 8 (eight) hours as needed for muscle spasms. (Patient not taking: Reported on 01/08/2021), Disp: 15 tablet, Rfl: 0   Allergies  Allergen Reactions  . Lactose Intolerance (Gi) Other (See Comments)    REACTION:  GI upset  . Other Other (See Comments)    REACTION:  GI upset    Past Medical History:  Diagnosis Date  . Heart murmur   . Hip fracture ([place])   . Neck fracture ([place])   . Premature birth   . Scoliosis   . Seasonal allergies   . Shoulder dislocation   . Wrist fracture      Past Surgical History:  Procedure Laterality Date  . KNEE SURGERY     NPFL on left and right and meniscus repair to left knee (total of 3 knee surgeries) per mother and patient  . TONSILLECTOMY    . TONSILLECTOMY AND ADENOIDECTOMY       Family History  Problem Relation Age of Onset  . Hypertension Mother     Social History   Tobacco Use  . Smoking status: Never Smoker  . Smokeless tobacco: Never Used  Substance Use Topics  . Alcohol use: No  . Drug use: No    ROS   Objective:   Vitals: BP 124/84 (BP Location: Right Arm)   Pulse 78   Temp 98 F (36.7 C) (Oral)   Resp 18   SpO2 99%   Physical Exam Constitutional:      General: She is not in acute distress.    Appearance: Normal appearance. She is well-developed. She is not ill-appearing, toxic-appearing or diaphoretic.  HENT:     Head: Normocephalic and atraumatic.     Right Ear: Tympanic membrane and ear canal normal. No drainage or tenderness. No middle ear effusion. Tympanic membrane is not erythematous.     Left Ear: Tympanic membrane and ear canal normal. No drainage or tenderness.  No middle ear effusion. Tympanic membrane is not erythematous.     Nose: Congestion present. No rhinorrhea.     Mouth/Throat:     Mouth: Mucous membranes are moist. No oral lesions.     Pharynx: No pharyngeal swelling, oropharyngeal exudate, posterior oropharyngeal erythema or uvula swelling.     Tonsils: No tonsillar exudate or tonsillar abscesses.  Comments: Post-nasal drainage.  Eyes:     General: No scleral icterus.       Right eye: No discharge.        Left eye: No discharge.     Extraocular Movements: Extraocular movements intact.     Right eye: Normal extraocular motion.     Left eye: Normal extraocular motion.     Conjunctiva/sclera: Conjunctivae normal.     Pupils: Pupils are equal, round, and reactive to light.  Cardiovascular:     Rate and Rhythm: Normal rate and regular rhythm.     Pulses: Normal pulses.     Heart sounds: Normal heart sounds. No murmur heard. No friction rub. No gallop.   Pulmonary:     Effort: Pulmonary effort is normal. No respiratory distress.     Breath sounds: Normal breath sounds. No stridor. No wheezing, rhonchi or  rales.  Musculoskeletal:     Cervical back: Normal range of motion and neck supple.  Lymphadenopathy:     Cervical: No cervical adenopathy.  Skin:    General: Skin is warm and dry.     Findings: No rash.  Neurological:     General: No focal deficit present.     Mental Status: She is alert and oriented to person, place, and time.  Psychiatric:        Mood and Affect: Mood normal.        Behavior: Behavior normal.        Thought Content: Thought content normal.        Judgment: Judgment normal.      Assessment and Plan :   PDMP not reviewed this encounter.  1. Viral syndrome   2. Encounter for screening laboratory testing for COVID-19 virus   3. Cough   4. Productive cough   5. Close exposure to COVID-19 virus     Will manage for viral illness such as viral URI, viral syndrome, viral rhinitis, COVID-19. Counseled patient on nature of COVID-19 including modes of transmission, diagnostic testing, management and supportive care.  Offered scripts for symptomatic relief. COVID 19 testing is pending. Counseled patient on potential for adverse effects with medications prescribed/recommended today, ER and return-to-clinic precautions discussed, patient verbalized understanding.     Jaynee Eagles, PA-C 01/08/21 1404

## 2021-01-08 NOTE — ED Triage Notes (Signed)
Pt here for cough with blood tinged sputum; pt sts cough x 3 days and starting noticing blood in sputum today

## 2021-01-08 NOTE — Discharge Instructions (Signed)

## 2021-01-09 LAB — SARS-COV-2, NAA 2 DAY TAT

## 2021-01-09 LAB — NOVEL CORONAVIRUS, NAA: SARS-CoV-2, NAA: NOT DETECTED

## 2022-01-03 ENCOUNTER — Other Ambulatory Visit: Payer: Self-pay

## 2022-01-03 ENCOUNTER — Emergency Department (HOSPITAL_COMMUNITY)
Admission: EM | Admit: 2022-01-03 | Discharge: 2022-01-03 | Disposition: A | Discharge status: Home / Self Care | Payer: BC Managed Care – PPO | Attending: Emergency Medicine | Admitting: Emergency Medicine

## 2022-01-03 ENCOUNTER — Encounter (HOSPITAL_COMMUNITY): Payer: Self-pay | Admitting: Emergency Medicine

## 2022-01-03 ENCOUNTER — Emergency Department (HOSPITAL_COMMUNITY): Payer: BC Managed Care – PPO

## 2022-01-03 ENCOUNTER — Ambulatory Visit (HOSPITAL_COMMUNITY)
Admission: EM | Admit: 2022-01-03 | Discharge: 2022-01-03 | Disposition: A | Discharge status: Home / Self Care | Payer: BC Managed Care – PPO

## 2022-01-03 DIAGNOSIS — R102 Pelvic and perineal pain: Secondary | ICD-10-CM

## 2022-01-03 DIAGNOSIS — R1032 Left lower quadrant pain: Secondary | ICD-10-CM | POA: Diagnosis present

## 2022-01-03 DIAGNOSIS — R1031 Right lower quadrant pain: Secondary | ICD-10-CM | POA: Insufficient documentation

## 2022-01-03 DIAGNOSIS — Z20822 Contact with and (suspected) exposure to covid-19: Secondary | ICD-10-CM | POA: Diagnosis not present

## 2022-01-03 LAB — CBC WITH DIFFERENTIAL/PLATELET
Abs Immature Granulocytes: 0.04 10*3/uL (ref 0.00–0.07)
Basophils Absolute: 0.1 10*3/uL (ref 0.0–0.1)
Basophils Relative: 1 %
Eosinophils Absolute: 0.2 10*3/uL (ref 0.0–0.5)
Eosinophils Relative: 1 %
HCT: 45.3 % (ref 36.0–46.0)
Hemoglobin: 14.5 g/dL (ref 12.0–15.0)
Immature Granulocytes: 0 %
Lymphocytes Relative: 22 %
Lymphs Abs: 2.6 10*3/uL (ref 0.7–4.0)
MCH: 30.1 pg (ref 26.0–34.0)
MCHC: 32 g/dL (ref 30.0–36.0)
MCV: 94 fL (ref 80.0–100.0)
Monocytes Absolute: 0.6 10*3/uL (ref 0.1–1.0)
Monocytes Relative: 5 %
Neutro Abs: 8.5 10*3/uL — ABNORMAL HIGH (ref 1.7–7.7)
Neutrophils Relative %: 71 %
Platelets: 278 10*3/uL (ref 150–400)
RBC: 4.82 MIL/uL (ref 3.87–5.11)
RDW: 12.3 % (ref 11.5–15.5)
WBC: 12 10*3/uL — ABNORMAL HIGH (ref 4.0–10.5)
nRBC: 0 % (ref 0.0–0.2)

## 2022-01-03 LAB — COMPREHENSIVE METABOLIC PANEL
ALT: 25 U/L (ref 0–44)
AST: 25 U/L (ref 15–41)
Albumin: 4.3 g/dL (ref 3.5–5.0)
Alkaline Phosphatase: 64 U/L (ref 38–126)
Anion gap: 10 (ref 5–15)
BUN: 9 mg/dL (ref 6–20)
CO2: 23 mmol/L (ref 22–32)
Calcium: 9.5 mg/dL (ref 8.9–10.3)
Chloride: 106 mmol/L (ref 98–111)
Creatinine, Ser: 0.68 mg/dL (ref 0.44–1.00)
GFR, Estimated: >60 mL/min (ref >60)
Glucose, Bld: 88 mg/dL (ref 70–99)
Potassium: 4.2 mmol/L (ref 3.5–5.1)
Sodium: 139 mmol/L (ref 135–145)
Total Bilirubin: 0.7 mg/dL (ref 0.3–1.2)
Total Protein: 7.4 g/dL (ref 6.5–8.1)

## 2022-01-03 LAB — URINALYSIS, ROUTINE W REFLEX MICROSCOPIC
Bilirubin Urine: NEGATIVE
Glucose, UA: NEGATIVE mg/dL
Hgb urine dipstick: NEGATIVE
Ketones, ur: 15 mg/dL — AB
Leukocytes,Ua: NEGATIVE
Nitrite: NEGATIVE
Protein, ur: NEGATIVE mg/dL
Specific Gravity, Urine: 1.02 (ref 1.005–1.030)
pH: 6 (ref 5.0–8.0)

## 2022-01-03 LAB — RESP PANEL BY RT-PCR (FLU A&B, COVID) ARPGX2
Influenza A by PCR: NEGATIVE
Influenza B by PCR: NEGATIVE
SARS Coronavirus 2 by RT PCR: NEGATIVE

## 2022-01-03 LAB — WET PREP, GENITAL
Clue Cells Wet Prep HPF POC: NONE SEEN
Sperm: NONE SEEN
Trich, Wet Prep: NONE SEEN
WBC, Wet Prep HPF POC: >=10 — AB (ref <10)
Yeast Wet Prep HPF POC: NONE SEEN

## 2022-01-03 LAB — LIPASE, BLOOD: Lipase: 25 U/L (ref 11–51)

## 2022-01-03 MED ORDER — IBUPROFEN 600 MG PO TABS: 600.0000 mg | ORAL_TABLET | Freq: Four times a day (QID) | ORAL | 0 refills | Status: DC | PRN

## 2022-01-03 NOTE — ED Triage Notes (Signed)
Pt reports increase LLQ pain radiating to the pelvis since early AM. Pt seen by UC and recommended to come to ER.

## 2022-01-03 NOTE — Discharge Instructions (Signed)
Please go to ED for care

## 2022-01-03 NOTE — Discharge Instructions (Addendum)
Your pelvic pain could be due to a ruptured ovarian cyst or related to your endometriosis.  Ultrasound did show evidence of hemorrhagic ovarian cyst on the right side.  I recommend taking ibuprofen as needed for pain as this pain would likely improve over time.  We also have screen you for potential sexually transmitted infection, results of those tests will come back in the next few days and if positive we will contact you with appropriate treatment.  Return to the ER if you develop fever, significant lower abdominal pain, or if you have any other concern

## 2022-01-03 NOTE — ED Triage Notes (Signed)
Pt reports that having cervical pain that started a few hours ago. Reports that has endometriosis and PCOS, cycle not supposed to start for a couple weeks so concerned at what cause of pain could be.

## 2022-01-03 NOTE — ED Provider Triage Note (Signed)
Emergency Medicine Provider Triage Evaluation Note  Jo Ward , a 21 y.o. female  was evaluated in triage.  Pt complains of LLQ abdominal pain that started at 830-9am.  She reports that it has radiated across to suprapubic and right side however is still very severe on the left.  She denies any fevers.  Last bowel movement was this morning and it was normal.  She states she has a history of endometriosis and pain on the left side however this is significantly different. She notes that her mom had ovarian torsion x2. No nausea vomiting or diarrhea.  Denies possibility of pregnancy.  No urinary symptoms.  Review of Systems  Positive: See above Negative:   Physical Exam  BP 114/90 (BP Location: Right Arm)    Pulse 76    Temp 98.8 F (37.1 C) (Oral)    Resp 18    LMP 12/17/2021    SpO2 98%  Gen:   Awake, appears uncomfortable Resp:  Normal effort  MSK:   Moves extremities without difficulty  Other:  Abdomen is very tender to palpation in the left lower quadrant.  Less in the suprapubic and not in the right lower quadrant.  Medical Decision Making  Medically screening exam initiated at 1:46 PM.  Appropriate orders placed.  Jo Ward was informed that the remainder of the evaluation will be completed by another provider, this initial triage assessment does not replace that evaluation, and the importance of remaining in the ED until their evaluation is complete.  Patient is a 21 year old who presents today for evaluation of sudden onset left-sided pelvic pain.  The pain is a 8 out of 10.  She denies possibility of pregnancy, or any urinary symptoms. Given the sudden onset and unilateral nature of her pain concern for torsion. Torsion study is ordered along with COVID test, lab work.    1356: Called ultrasound to confirm that they can start her torsion study without pregnancy test back.  Note: Portions of this report may have been transcribed using voice recognition software. Every  effort was made to ensure accuracy; however, inadvertent computerized transcription errors may be present     Lorin Glass, PA-C 01/03/22 1357

## 2022-01-03 NOTE — ED Provider Notes (Signed)
[place]    CSN: [id number] Arrival date & time: 01/03/22  1019      History   Chief Complaint Chief Complaint  Patient presents with   Pelvic Pain    HPI Jo Ward is a 21 y.o. female. Woke up this morning feeling well, then had abrupt onset LLQ pain that has now spread to suprapubic and RLQ area. Hx L ovary "inflammation" and endometriosis. Period is not due for 2 weeks, states she could not be pregnant. Pain is constant. Has not taken anything for pain.    Pelvic Pain Associated symptoms include abdominal pain.   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   Whole body pain 10/22/2017   Partial Achilles tendon tear, left, sequela 11/05/2016   Posterior tibial tendonitis, left 11/05/2016   Sprain of anterior talofibular ligament of left ankle 11/05/2016   Left knee pain 09/27/2015   Other specified postprocedural states 03/24/2015   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      Home Medications    Prior to Admission medications   Medication Sig Start Date End Date Taking? Authorizing Provider  acetaminophen (TYLENOL) 500 MG tablet Take 1,000 mg by mouth every 6 (six) hours as needed for moderate pain (pain).    [provider]  benzonatate (TESSALON) 100 MG capsule Take 1-2 capsules (100-200 mg total) by mouth 3 (three) times daily as needed for cough.  01/08/21   Jaynee Eagles, PA-C  cetirizine (ZYRTEC ALLERGY) 10 MG tablet Take 1 tablet (10 mg total) by mouth daily. 01/08/21   Jaynee Eagles, PA-C  diclofenac (VOLTAREN) 50 MG EC tablet Take 1 tablet (50 mg total) by mouth 2 (two) times daily. Patient not taking: Reported on 01/08/2021 04/25/20   Ok Edwards, PA-C  gabapentin (NEURONTIN) 300 MG capsule Take 300 mg by mouth 3 (three) times daily. Patient not taking: Reported on 01/08/2021    [provider]  medroxyPROGESTERone (DEPO-PROVERA) 150 MG/ML injection  02/10/18   [provider]  promethazine-dextromethorphan (PROMETHAZINE-DM) 6.25-15 MG/5ML syrup Take 5 mLs by mouth at bedtime as needed for cough. 01/08/21   Jaynee Eagles, PA-C  pseudoephedrine (SUDAFED) 60 MG tablet Take 1 tablet (60 mg total) by mouth every 8 (eight) hours as needed for congestion. 01/08/21   Jaynee Eagles, PA-C  tiZANidine (ZANAFLEX) 2 MG tablet Take 1 tablet (2 mg total) by mouth every 8 (eight) hours as needed for muscle spasms. Patient not taking: Reported on 01/08/2021 04/25/20   Ok Edwards, PA-C  promethazine (PHENERGAN) 12.5 MG tablet  02/10/18 04/25/20  [provider]    Family History Family History  Problem Relation Age of Onset   Hypertension Mother     Social History Social History   Tobacco Use  Smoking status: Never   Smokeless tobacco: Never  Substance Use Topics   Alcohol use: No   Drug use: No     Allergies   Lactose intolerance (gi) and Other   Review of Systems Review of Systems  Constitutional:  Negative for chills and fever.  Gastrointestinal:  Positive for abdominal pain. Negative for nausea and vomiting.  Genitourinary:  Positive for pelvic pain. Negative for vaginal bleeding and vaginal discharge.    Physical Exam Triage Vital Signs ED Triage Vitals  Enc Vitals Group     BP 01/03/22 1201 113/79     Pulse Rate 01/03/22 1201 78     Resp 01/03/22 1201 19     Temp 01/03/22 1201 98.5 F (36.9 C)     Temp  Source 01/03/22 1201 Oral     SpO2 01/03/22 1201 98 %     Weight --      Height --      Head Circumference --      Peak Flow --      Pain Score 01/03/22 1200 8     Pain Loc --      Pain Edu? --      Excl. in [place]? --    No data found.  Updated Vital Signs BP 113/79 (BP Location: Right Arm)    Pulse 78    Temp 98.5 F (36.9 C) (Oral)    Resp 19    LMP 12/17/2021    SpO2 98%   Visual Acuity Right Eye Distance:   Left Eye Distance:   Bilateral Distance:    Right Eye Near:   Left Eye Near:    Bilateral Near:     Physical Exam Constitutional:      Comments: Appears uncomfortable  Cardiovascular:     Rate and Rhythm: Normal rate and regular rhythm.  Pulmonary:     Effort: Pulmonary effort is normal.     Breath sounds: Normal breath sounds.  Abdominal:     General: Abdomen is flat. Bowel sounds are normal.     Tenderness: There is abdominal tenderness in the right lower quadrant, suprapubic area and left lower quadrant. There is no guarding or rebound.  Neurological:     Mental Status: She is alert.     UC Treatments / Results  Labs (all labs ordered are listed, but only abnormal results are displayed) Labs Reviewed - No data to display  EKG   Radiology No results found.  Procedures Procedures (including critical care time)  Medications Ordered in UC Medications - No data to display  Initial Impression / Assessment and Plan / UC Course  I have reviewed the triage vital signs and the nursing notes.  Pertinent labs & imaging results that were available during my care of the patient were reviewed by me and considered in my medical decision making (see chart for details).    Directed pt to ED. She may need [place] which is not a service in urgent care.   Final Clinical Impressions(s) / UC Diagnoses   Final diagnoses:  Pelvic pain in female     Discharge Instructions      Please go to ED for care   ED Prescriptions   None    PDMP not reviewed this  encounter.   Carvel Getting, NP 01/03/22 1241

## 2022-01-03 NOTE — ED Notes (Signed)
Pt verbalized understanding of discharge paperwork and follow-up care.  °

## 2022-01-03 NOTE — ED Provider Notes (Signed)
[facility] EMERGENCY DEPARTMENT Provider Note   CSN: [id number] Arrival date & time: 01/03/22  1242     History  Chief Complaint  Patient presents with   Abdominal Pain    LLQ     Jo Ward is a 21 y.o. female.   Abdominal Pain   Pt presents to the ED with new onset LLQ pain. This began this morning when on the way to work. She states that the pain radiates across her lower abdomen and rates the pain as an 8/10. It is worse when moving around, and improves when lying still. She does have a hx of endometriosis and PCOS. Sxs today are similar to the pain she has had in the past but is longer lasting. She is currently sexually active, LMP was 2 weeks ago. Denies N/V/D/C, fever/chills. No vaginal bleeding or vaginal discharge.   Home Medications Prior to Admission medications   Medication Sig Start Date End Date Taking? Authorizing Provider  acetaminophen (TYLENOL) 500 MG tablet Take 1,000 mg by mouth every 6 (six) hours as needed for moderate pain (pain).    [provider]  benzonatate (TESSALON) 100 MG capsule Take 1-2 capsules (100-200 mg total) by mouth 3 (three) times daily as needed for cough. 01/08/21   Jaynee Eagles, PA-C  cetirizine (ZYRTEC ALLERGY) 10 MG tablet Take 1 tablet (10 mg total) by mouth daily. 01/08/21   Jaynee Eagles, PA-C  diclofenac (VOLTAREN) 50 MG EC tablet Take 1 tablet (50 mg total) by mouth 2 (two) times daily. Patient not taking: Reported on 01/08/2021 04/25/20   Ok Edwards, PA-C  gabapentin (NEURONTIN) 300 MG capsule Take 300 mg by mouth 3 (three) times daily. Patient not taking: Reported on 01/08/2021    [provider]  medroxyPROGESTERone (DEPO-PROVERA) 150 MG/ML injection  02/10/18   [provider]  promethazine-dextromethorphan (PROMETHAZINE-DM) 6.25-15 MG/5ML syrup Take 5 mLs by mouth at bedtime as needed for cough. 01/08/21   Jaynee Eagles, PA-C  pseudoephedrine (SUDAFED) 60 MG tablet Take 1 tablet (60 mg  total) by mouth every 8 (eight) hours as needed for congestion. 01/08/21   Jaynee Eagles, PA-C  tiZANidine (ZANAFLEX) 2 MG tablet Take 1 tablet (2 mg total) by mouth every 8 (eight) hours as needed for muscle spasms. Patient not taking: Reported on 01/08/2021 04/25/20   Ok Edwards, PA-C  promethazine (PHENERGAN) 12.5 MG tablet  02/10/18 04/25/20  [provider]      Allergies    Lactose intolerance (gi) and Other    Review of Systems   Review of Systems  Gastrointestinal:  Positive for abdominal pain.  All other systems reviewed and are negative.  Physical Exam Updated Vital Signs BP 122/77    Pulse 82    Temp 98.8 F (37.1 C) (Oral)    Resp 16    LMP 12/17/2021    SpO2 100%  Physical Exam Vitals and nursing note reviewed.  Constitutional:      General: She is not in acute distress.    Appearance: She is well-developed.  HENT:     Head: Atraumatic.  Eyes:     Conjunctiva/sclera: Conjunctivae normal.  Cardiovascular:     Rate and Rhythm: Normal rate and regular rhythm.     Pulses: Normal pulses.     Heart sounds: Normal heart sounds.  Pulmonary:     Effort: Pulmonary effort is normal.  Abdominal:     Palpations: Abdomen is soft.     Tenderness: There is  abdominal tenderness (Tenderness to left lower abdomen/pelvic region on palpation without guarding or rebound tenderness.  Mild suprapubic tenderness.  Minimal right lower quadrant tenderness). There is no right CVA tenderness or left CVA tenderness.  Genitourinary:    Comments: Please refer to procedural note Musculoskeletal:     Cervical back: Neck supple.  Skin:    Findings: No rash.  Neurological:     Mental Status: She is alert.  Psychiatric:        Mood and Affect: Mood normal.    ED Results / Procedures / Treatments   Labs (all labs ordered are listed, but only abnormal results are displayed) Labs Reviewed  WET PREP, GENITAL - Abnormal; Notable for the following components:      Result Value   WBC, Wet  Prep HPF POC >=10 (*)    All other components within normal limits  CBC WITH DIFFERENTIAL/PLATELET - Abnormal; Notable for the following components:   WBC 12.0 (*)    Neutro Abs 8.5 (*)    All other components within normal limits  URINALYSIS, ROUTINE W REFLEX MICROSCOPIC - Abnormal; Notable for the following components:   Ketones, ur 15 (*)    All other components within normal limits  RESP PANEL BY RT-PCR (FLU A&B, COVID) ARPGX2  COMPREHENSIVE METABOLIC PANEL  LIPASE, BLOOD  I-STAT BETA HCG BLOOD, ED ([facility], [facility], [facility] ONLY)  GC/CHLAMYDIA PROBE AMP ([place]) NOT AT [facility]    EKG None  Radiology US PELVIC COMPLETE W TRANSVAGINAL AND TORSION R/O  Result Date: 01/03/2022 CLINICAL DATA:  Left pelvic pain EXAM: TRANSABDOMINAL AND TRANSVAGINAL ULTRASOUND OF PELVIS DOPPLER ULTRASOUND OF OVARIES TECHNIQUE: Both transabdominal and transvaginal ultrasound examinations of the pelvis were performed. Transabdominal technique was performed for global imaging of the pelvis including uterus, ovaries, adnexal regions, and pelvic cul-de-sac. It was necessary to proceed with endovaginal exam following the transabdominal exam to visualize the adnexa and endometrium. Color and duplex Doppler ultrasound was utilized to evaluate blood flow to the ovaries. COMPARISON:  None. FINDINGS: Uterus Measurements: 6.2 x 2.3 x 4.4 cm = volume: 32.6 mL. No fibroids or other mass visualized. Endometrium Thickness: 8 mm.  No focal abnormality visualized. Right ovary Measurements: 3.7 x 2.1 x 2.4 cm = volume: 9.6 mL. 1.7 x 1.4 cm hypoechoic likely hemorrhagic cyst with internal reticular and low-level echoes. Left ovary Measurements: 3.9 x 2.5 x 2.7 cm = volume: 13.9 mL. Normal appearance/no adnexal mass. Pulsed Doppler evaluation of both ovaries demonstrates normal low-resistance arterial and venous waveforms. Other findings Trace free fluid. IMPRESSION: 1. No acute process identified in the pelvis. 2. Small right ovarian hemorrhagic  cyst. Electronically Signed   By: Ofilia Neas M.D.   On: 01/03/2022 15:36    Procedures Pelvic exam  Date/Time: 01/03/2022 5:00 PM Performed by: Domenic Moras, PA-C Authorized by: Domenic Moras, PA-C  Comments: Maggie, RN was available as chaperone.  No inguinal lymphadenopathy or inguinal hernia noted.  Normal external genitalia.  No significant discomfort with speculum insertion.  Functional vaginal discharge noted.  Close cervical os without any significant bleeding or dystrophic skin changes.  On bimanual exam, left adnexal tenderness without cervical motion tenderness      Medications Ordered in ED Medications - No data to display  ED Course/ Medical Decision Making/ A&P                           Medical Decision Making Amount and/or Complexity of Data Reviewed Labs:  ordered.  Risk Prescription drug management.   BP 122/77    Pulse 82    Temp 98.8 F (37.1 C) (Oral)    Resp 16    LMP 12/17/2021    SpO2 100%    4:16 PM This is a 21 year old female presenting with complaints of acute onset of pain to her lower pelvic region that started earlier today.  She has known history of endometriosis and states that this pain felt somewhat similar.  Pain is more localized to her left lower pelvic region on exam.  She has no significant tenderness to her right lower quadrant.  No fever nausea vomiting diarrhea and she denies vaginal bleeding or vaginal discharge.  States she is sexually active.  With her presenting complaint, differential include ovarian cyst, tubo-ovarian abscess, ovarian torsion, appendicitis, colitis, UTI, or STI.  Patient overall well-appearing, suspicion for appendicitis is low.  Labs and imaging was independently reviewed and interpreted by me.  Labs obtained remarkable for mildly elevated white count of 12.0, screening COVID and flu test came back negative, urinalysis negative for UTI.  Pelvic ultrasound showed no evidence of ovarian torsion or acute process  identified the pelvis.  There is a small right ovarian hemorrhagic cysts were noted.  5:00 PM Pelvic exam performed by me show no evidence concerning for PID.  She does have left adnexal tenderness which I suspect could be related to ovarian cyst or endometriosis.  STI screening performed.  Patient declined prophylactic STI antibiotic at this time.  Currently have low suspicion for appendicitis  6:18 PM At this time I have not found any diagnosis requiring hospital admission.  Patient is stable for discharge.  Outpatient follow-up recommended work note provided per request.  Return precaution given        Final Clinical Impression(s) / ED Diagnoses Final diagnoses:  Pelvic pain    Rx / DC Orders ED Discharge Orders          Ordered    ibuprofen (ADVIL) 600 MG tablet  Every 6 hours PRN        01/03/22 1816              Domenic Moras, PA-C 01/03/22 1819    Wyvonnia Dusky, MD 01/03/22 2125

## 2022-01-03 NOTE — ED Notes (Signed)
Patient is being discharged from the Urgent Care and sent to the Emergency Department via POV . Per Willeen Cass, NP, patient is in need of higher level of care due to pelvic pain. Patient is aware and verbalizes understanding of plan of care.  Vitals:   01/03/22 1201  BP: 113/79  Pulse: 78  Resp: 19  Temp: 98.5 F (36.9 C)  SpO2: 98%

## 2022-01-04 LAB — GC/CHLAMYDIA PROBE AMP (~~LOC~~) NOT AT ARMC
Chlamydia: NEGATIVE
Comment: NEGATIVE
Comment: NORMAL
Neisseria Gonorrhea: NEGATIVE

## 2022-02-02 ENCOUNTER — Telehealth: Payer: BC Managed Care – PPO | Admitting: Emergency Medicine

## 2022-02-02 DIAGNOSIS — R102 Pelvic and perineal pain: Secondary | ICD-10-CM

## 2022-02-02 NOTE — Progress Notes (Signed)
Virtual Visit Consent   Jo Ward, you are scheduled for a virtual visit with a [facility] provider today.     Just as with appointments in the office, your consent must be obtained to participate.  Your consent will be active for this visit and any virtual visit you may have with one of our providers in the next 365 days.     If you have a MyChart account, a copy of this consent can be sent to you electronically.  All virtual visits are billed to your insurance company just like a traditional visit in the office.    As this is a virtual visit, video technology does not allow for your provider to perform a traditional examination.  This may limit your provider's ability to fully assess your condition.  If your provider identifies any concerns that need to be evaluated in person or the need to arrange testing (such as labs, EKG, etc.), we will make arrangements to do so.     Although advances in technology are sophisticated, we cannot ensure that it will always work on either your end or our end.  If the connection with a video visit is poor, the visit may have to be switched to a telephone visit.  With either a video or telephone visit, we are not always able to ensure that we have a secure connection.     I need to obtain your verbal consent now.   Are you willing to proceed with your visit today?    Jo Ward has provided verbal consent on 02/02/2022 for a virtual visit (video or telephone).   Montine Circle, PA-C   Date: 02/02/2022 3:47 PM   Virtual Visit via Video Note   I, Montine Circle, connected with  Jo Ward  ([id number], 2001-06-07) on 02/02/22 at  3:45 PM EST by a video-enabled telemedicine application and verified that I am speaking with the correct person using two identifiers.  Location: Patient: Virtual Visit Location Patient: Home Provider: Virtual Visit Location Provider: Home Office   I discussed the limitations of evaluation and management by  telemedicine and the availability of in person appointments. The patient expressed understanding and agreed to proceed.    History of Present Illness: Jo Ward is a 21 y.o. who identifies as a female who was assigned female at birth, and is being seen today for pelvic pain x 1 month.  States she has hx of hemorrhagic cysts on ovary.  Denies fever or vomiting.  Pain has been persistent.  Had her cycle 2 weeks ago that was heavier than normal.  Has been taking midol and Naproxen.  Concerned about the persistent pain. She states she needs a note for work. 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    Allergies:  Allergies  Allergen Reactions   Lactose Intolerance (Gi) Other (See Comments)    REACTION:  GI upset   Other Other (See Comments)    REACTION:  GI upset   Medications:  Current Outpatient Medications:    acetaminophen (TYLENOL) 500 MG  tablet, Take 1,000 mg by mouth every 6 (six) hours as needed for moderate pain (pain)., Disp: , Rfl:    benzonatate (TESSALON) 100 MG capsule, Take 1-2 capsules (100-200 mg total) by mouth 3 (three) times daily as needed for cough., Disp: 60 capsule, Rfl: 0   cetirizine (ZYRTEC ALLERGY) 10 MG tablet, Take 1 tablet (10 mg total) by mouth daily., Disp: 30 tablet, Rfl: 0   ibuprofen (ADVIL) 600 MG tablet, Take 1 tablet (600 mg total) by mouth every 6 (six) hours as needed., Disp: 30 tablet, Rfl: 0   medroxyPROGESTERone (DEPO-PROVERA) 150 MG/ML injection, , Disp: , Rfl:    promethazine-dextromethorphan  (PROMETHAZINE-DM) 6.25-15 MG/5ML syrup, Take 5 mLs by mouth at bedtime as needed for cough., Disp: 100 mL, Rfl: 0   pseudoephedrine (SUDAFED) 60 MG tablet, Take 1 tablet (60 mg total) by mouth every 8 (eight) hours as needed for congestion., Disp: 30 tablet, Rfl: 0  Observations/Objective: Patient is well-developed, well-nourished in no acute distress.  Resting comfortably  at home.  Head is normocephalic, atraumatic.  No labored breathing.  Speech is clear and coherent with logical content.  Patient is alert and oriented at baseline.    Assessment and Plan: There are no diagnoses linked to this encounter. - Pelvic pain x1 month.  Dx with hemorrhagic ovarian cyst -Continue NSAIDs -OBGYN followup  Follow Up Instructions: I discussed the assessment and treatment plan with the patient. The patient was provided an opportunity to ask questions and all were answered. The patient agreed with the plan and demonstrated an understanding of the instructions.  A copy of instructions were sent to the patient via MyChart unless otherwise noted below.     The patient was advised to call back or seek an in-person evaluation if the symptoms worsen or if the condition fails to improve as anticipated.  Time:  I spent 12 minutes with the patient via telehealth technology discussing the above problems/concerns.    Montine Circle, PA-C

## 2022-11-23 IMAGING — US US PELVIS COMPLETE TRANSABD/TRANSVAG W DUPLEX AND/OR DOPPLER
1 series · 13 of 25 positions shown · non-contrast
Comparison: None.

CLINICAL DATA: Left pelvic pain

EXAM:
TRANSABDOMINAL AND TRANSVAGINAL ULTRASOUND OF PELVIS
DOPPLER ULTRASOUND OF OVARIES
TECHNIQUE: Both transabdominal and transvaginal ultrasound examinations of the
pelvis were performed. Transabdominal technique was performed for
global imaging of the pelvis including uterus, ovaries, adnexal
regions, and pelvic cul-de-sac.
It was necessary to proceed with endovaginal exam following the
transabdominal exam to visualize the adnexa and endometrium. Color
and duplex Doppler ultrasound was utilized to evaluate blood flow to
the ovaries.

[Series 1: us pelvic complete w transvaginal and torsion righ · 100 acquisitions, 13 frames shown]
[im 1/100]
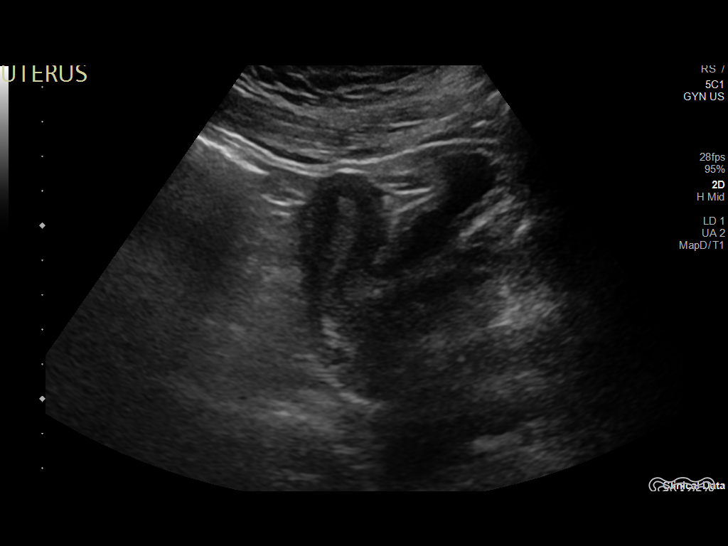
[im 9/100]
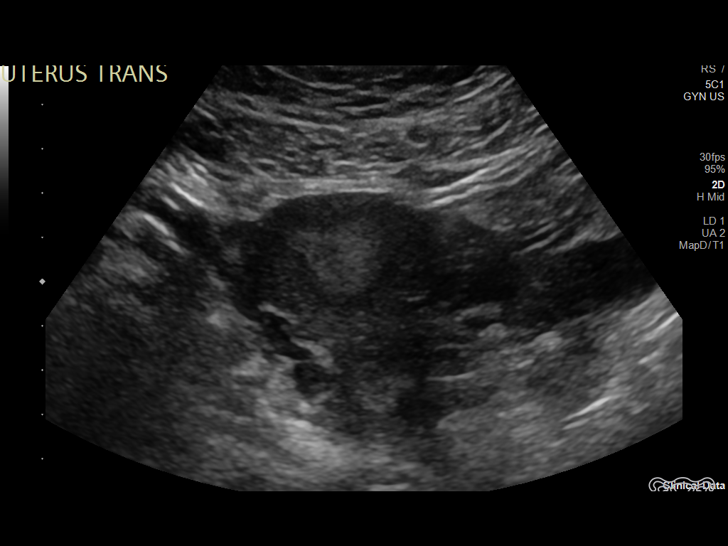
[im 17/100]
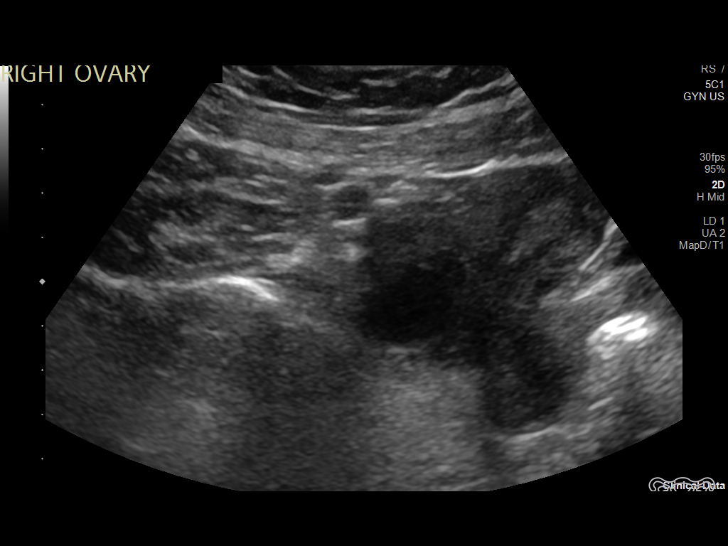
[im 25/100]
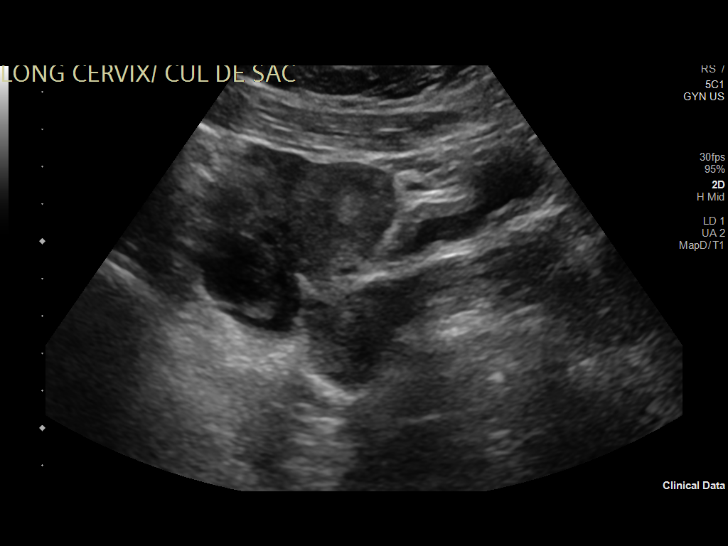
[im 34/100]
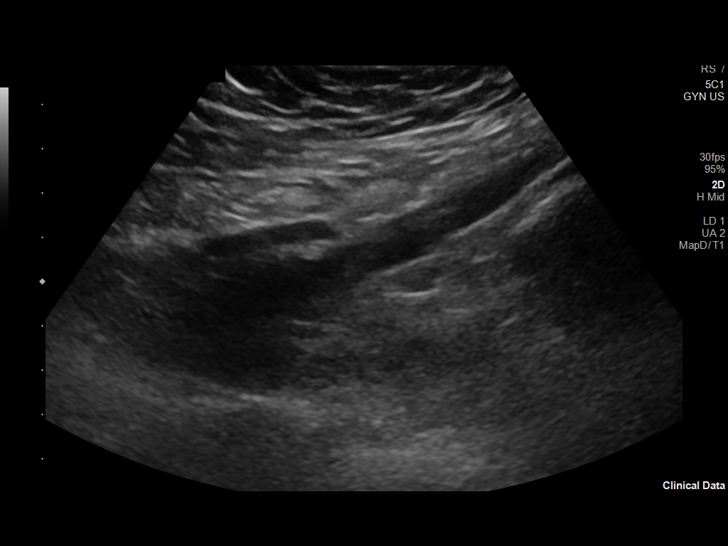
[im 42/100]
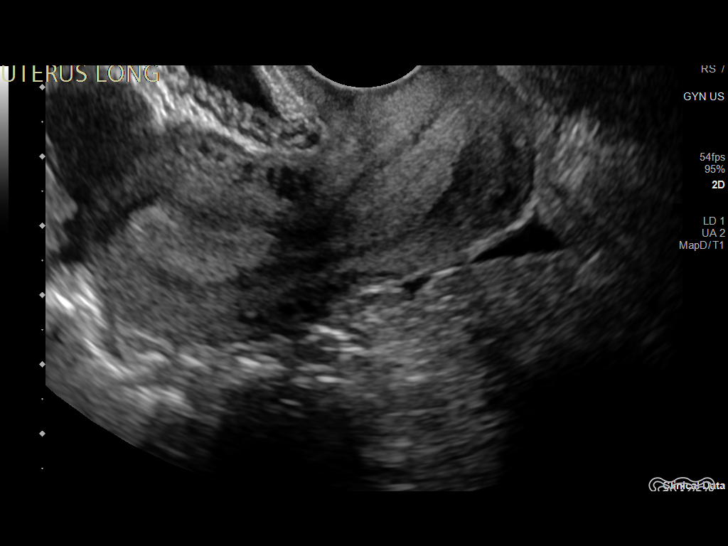
[im 50/100]
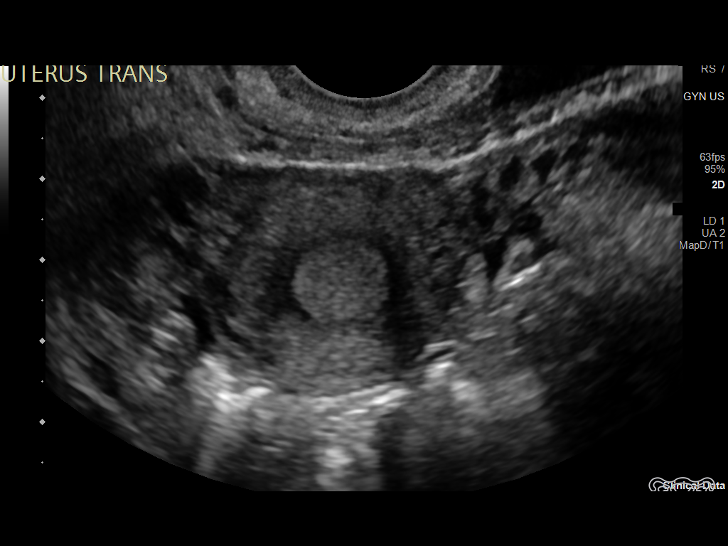
[im 58/100]
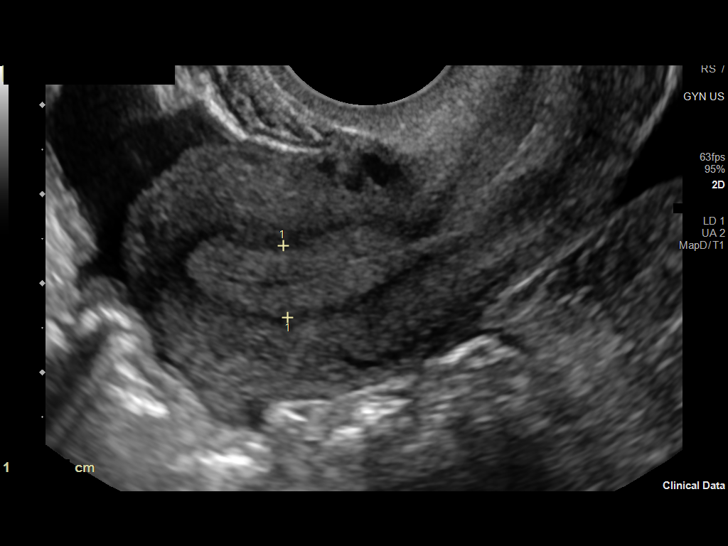
[im 67/100]
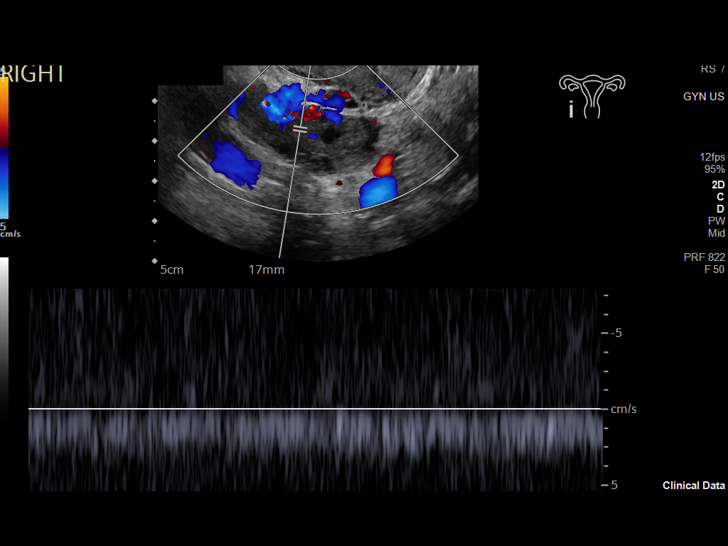
[im 75/100]
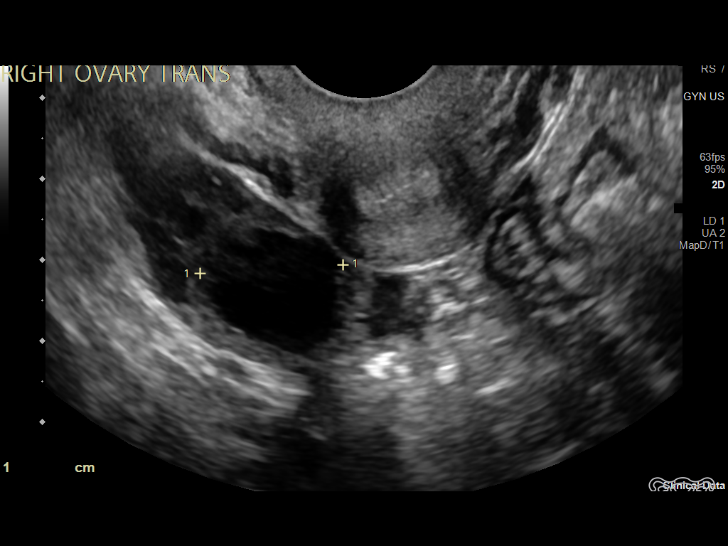
[im 83/100]
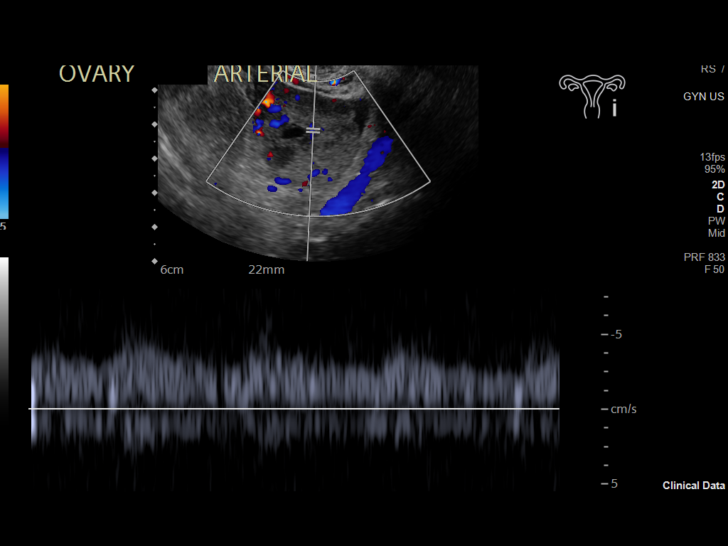
[im 91/100]
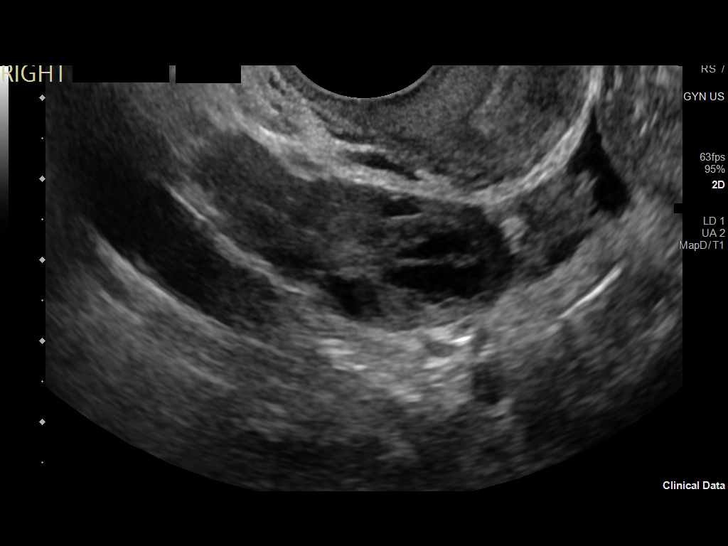
[im 100/100]
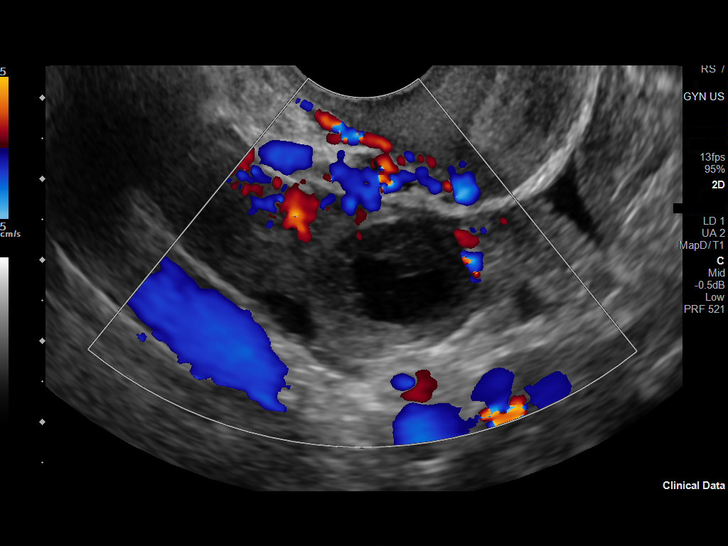

[13 of 25 positions shown; findings below may reference images not displayed]

FINDINGS: Uterus

Measurements: 6.2 x 2.3 x 4.4 cm = volume: 32.6 mL. No fibroids or
other mass visualized.

Endometrium

Thickness: 8 mm.  No focal abnormality visualized.

Right ovary

Measurements: 3.7 x 2.1 x 2.4 cm = volume: 9.6 mL. 1.7 x 1.4 cm
hypoechoic likely hemorrhagic cyst with internal reticular and
low-level echoes.

Left ovary

Measurements: 3.9 x 2.5 x 2.7 cm = volume: 13.9 mL. Normal
appearance/no adnexal mass.

Pulsed Doppler evaluation of both ovaries demonstrates normal
low-resistance arterial and venous waveforms.

Other findings

Trace free fluid.
IMPRESSION: 1. No acute process identified in the pelvis.
2. Small right ovarian hemorrhagic cyst.

## 2023-02-08 ENCOUNTER — Ambulatory Visit
Admission: EM | Admit: 2023-02-08 | Discharge: 2023-02-08 | Disposition: A | Discharge status: Home / Self Care | Payer: BC Managed Care – PPO | Attending: Physician Assistant | Admitting: Physician Assistant

## 2023-02-08 DIAGNOSIS — Z1152 Encounter for screening for COVID-19: Secondary | ICD-10-CM | POA: Diagnosis present

## 2023-02-08 DIAGNOSIS — J069 Acute upper respiratory infection, unspecified: Secondary | ICD-10-CM | POA: Diagnosis not present

## 2023-02-08 DIAGNOSIS — R062 Wheezing: Secondary | ICD-10-CM | POA: Diagnosis not present

## 2023-02-08 DIAGNOSIS — R051 Acute cough: Secondary | ICD-10-CM | POA: Diagnosis not present

## 2023-02-08 DIAGNOSIS — J029 Acute pharyngitis, unspecified: Secondary | ICD-10-CM

## 2023-02-08 LAB — POCT RAPID STREP A (OFFICE): Rapid Strep A Screen: NEGATIVE

## 2023-02-08 LAB — POCT INFLUENZA A/B
Influenza A, POC: NEGATIVE
Influenza B, POC: NEGATIVE

## 2023-02-08 LAB — SARS CORONAVIRUS 2 (TAT 6-24 HRS): SARS Coronavirus 2: NEGATIVE

## 2023-02-08 MED ORDER — DM-GUAIFENESIN ER 30-600 MG PO TB12: 1.0000 | ORAL_TABLET | Freq: Two times a day (BID) | ORAL | 0 refills | Status: AC

## 2023-02-08 MED ORDER — SPACER/AERO-HOLDING CHAMBERS DEVI
1.0000 [IU] | Freq: Three times a day (TID) | 0 refills | Status: DC
Start: 2023-02-08 — End: 2024-06-29

## 2023-02-08 MED ORDER — ALBUTEROL SULFATE (2.5 MG/3ML) 0.083% IN NEBU
2.5000 mg | INHALATION_SOLUTION | Freq: Once | RESPIRATORY_TRACT | Status: AC
  Administered 2023-02-08: 2.5 mg via RESPIRATORY_TRACT

## 2023-02-08 MED ORDER — ALBUTEROL SULFATE HFA 108 (90 BASE) MCG/ACT IN AERS: 2.0000 | INHALATION_SPRAY | Freq: Four times a day (QID) | RESPIRATORY_TRACT | 0 refills | Status: DC | PRN

## 2023-02-08 NOTE — Discharge Instructions (Addendum)
This will be completed in 48 hours.  If you do not hear from this office in 48 hours that indicates the test is negative.  Log onto MyChart to be the test results with post in 48 hours.  Advised to use the albuterol inhaler with spacer, 2 puffs every 6 hours on a regular basis to help decrease wheezing, cough, chest congestion. Advised take the Mucinex DM every 12 hours to help control cough and congestion.  Advised take ibuprofen or Motrin as needed for body aches, fever or chills.  Advised to follow-up PCP or return to urgent care if symptoms fail to improve.

## 2023-02-08 NOTE — ED Triage Notes (Signed)
Pt c/o cough, body aches, wheezing, nasal/chest congestion and chills x2 days. States took tylenol with little relief. States exposed to a sick co-worker.

## 2023-02-08 NOTE — ED Provider Notes (Signed)
[name]    CSN: [id number] Arrival date & time: 02/08/23  0940      History   Chief Complaint Chief Complaint  Patient presents with   Cough    HPI Jo Ward is a 22 y.o. female.   22 year old female presents with sore throat, cough, chills and wheezing.  Patient indicates for the past 2 days she has been having increasing upper respiratory congestion with rhinitis, postnasal drip, with clear to yellow production.  Patient also indicates that she has been having sore throat and painful swallowing which has been progressive over the past 2 days.  She also indicates she has been having chest congestion, with intermittent coughing spasms, wheezing and shortness of breath with cough.  Patient indicates she has been having fatigue, chills, body aches and discomfort.  Patient indicates that she "feels like I been hit by a truck".  She indicates she has been around a coworker who had similar symptoms and was sick but she does not know what she had.  Patient indicates she has been using OTC preparations without relief of her symptoms.  She indicates she has felt like she has been running fever but she has not been able to take her temperature.  Indicates she does not smoke or vape.  She indicates that her mother just went through surgery and also is a cancer survivor and is immunocompromised so she is concerned and does not want to be around her when she was sick.  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  She is tolerating fluids well and is without nausea or vomiting.   Cough Associated symptoms: chills, shortness of breath and wheezing     Past Medical History:  Diagnosis Date   Heart murmur    Hip fracture (HCC)    Neck fracture (HCC)    Premature birth    Scoliosis    Seasonal allergies    Shoulder dislocation    Wrist fracture     Patient Active Problem List   Diagnosis Date Noted   Cervical lymphadenopathy 03/20/2018   Benign neoplasm  of parotid gland 01/20/2018   Whole body pain 10/22/2017   Partial Achilles tendon tear, left, sequela 11/05/2016   Posterior tibial tendonitis, left 11/05/2016   Sprain of anterior talofibular ligament of left ankle 11/05/2016   Left knee pain 09/27/2015   Other specified postprocedural states 03/24/2015   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      Home Medications    Prior to Admission medications   Medication Sig Start Date End Date Taking? Authorizing Provider  albuterol (VENTOLIN HFA) 108 (90 Base) MCG/ACT inhaler Inhale 2 puffs into the lungs every 6 (six) hours as needed for wheezing or shortness of breath. 02/08/23  Yes Nyoka Lint, PA-C  dextromethorphan-guaiFENesin [facility] DM) 30-600 MG 12hr tablet Take 1 tablet by mouth 2 (two) times daily. 02/08/23  Yes Nyoka Lint, PA-C  Spacer/Aero-Holding Chambers DEVI 1 Units by Does not apply route in the morning, at noon, and at bedtime. 02/08/23  Yes Nyoka Lint, PA-C  acetaminophen (TYLENOL) 500 MG tablet Take 1,000 mg by mouth  every 6 (six) hours as needed for moderate pain (pain).    [provider]  promethazine (PHENERGAN) 12.5 MG tablet  02/10/18 04/25/20  [provider]    Family History Family History  Problem Relation Age of Onset   Hypertension Mother     Social History Social History   Tobacco Use   Smoking status: Never   Smokeless tobacco: Never  Substance Use Topics   Alcohol use: No   Drug use: No     Allergies   Lactose intolerance (gi) and Other   Review of Systems Review of Systems  Constitutional:  Positive for chills and fatigue.  Respiratory:  Positive for cough, shortness of breath  and wheezing.      Physical Exam Triage Vital Signs ED Triage Vitals [02/08/23 1003]  Enc Vitals Group     BP 125/84     Pulse Rate 84     Resp 18     Temp 98 F (36.7 C)     Temp Source Oral     SpO2 97 %     Weight      Height      Head Circumference      Peak Flow      Pain Score 6     Pain Loc      Pain Edu?      Excl. in [place]?    No data found.  Updated Vital Signs BP 125/84 (BP Location: Left Arm)   Pulse 84   Temp 98 F (36.7 C) (Oral)   Resp 18   LMP 01/31/2023   SpO2 97%   Visual Acuity Right Eye Distance:   Left Eye Distance:   Bilateral Distance:    Right Eye Near:   Left Eye Near:    Bilateral Near:     Physical Exam Constitutional:      Appearance: Normal appearance.  HENT:     Right Ear: Ear canal normal. Tympanic membrane is injected.     Left Ear: Ear canal normal. Tympanic membrane is injected.     Mouth/Throat:     Mouth: Mucous membranes are moist.     Pharynx: Oropharynx is clear. Posterior oropharyngeal erythema present. No oropharyngeal exudate.  Cardiovascular:     Rate and Rhythm: Normal rate and regular rhythm.     Heart sounds: Normal heart sounds.  Pulmonary:     Effort: Pulmonary effort is normal.     Breath sounds: Normal breath sounds and air entry. No wheezing, rhonchi or rales.  Lymphadenopathy:     Cervical: Cervical adenopathy (mild anterior adenopathy bilat) present.  Neurological:     Mental Status: She is alert.      UC Treatments / Results  Labs (all labs ordered are listed, but only abnormal results are displayed) Labs Reviewed  SARS CORONAVIRUS 2 (TAT 6-24 HRS)  POCT INFLUENZA A/B  POCT RAPID STREP A (OFFICE)    EKG   Radiology No results found.  Procedures Procedures (including critical care time)  Medications Ordered in UC Medications  albuterol (PROVENTIL) (2.5 MG/3ML) 0.083% nebulizer solution 2.5 mg (2.5 mg Nebulization Given 02/08/23 1026)    Initial Impression / Assessment and Plan /  UC Course  I have reviewed the triage vital signs and the nursing notes.  Pertinent labs & imaging results that were available during my care of the patient were reviewed by me and considered in my medical decision making (see chart for details).    Plan: The diagnosis will be  treated with the following: 1.  Acute cough: A.  Mucinex DM every 12 hours to control cough and congestion. 2.  Wheezing: A.  Albuterol inhaler with spacer, 2 puffs every 6 hours to control cough, wheezing, shortness of breath. 3.  Upper respiratory tract infection: A.  Mucinex DM every 12 hours to control cough and congestion. 4.  Sore throat: A.  Advised take ibuprofen or Tylenol along with lozenges and gargles to control sore throat. 5.  Screening for COVID-19: A.  Treatment may be modified depending on results of the COVID test. 6.  Advised follow-up PCP or return to urgent care if symptoms fail to improve.  Final Clinical Impressions(s) / UC Diagnoses   Final diagnoses:  Acute cough  Wheezing  Acute upper respiratory infection  Acute sore throat  Encounter for screening for COVID-19     Discharge Instructions      This will be completed in 48 hours.  If you do not hear from this office in 48 hours that indicates the test is negative.  Log onto MyChart to be the test results with post in 48 hours.  Advised to use the albuterol inhaler with spacer, 2 puffs every 6 hours on a regular basis to help decrease wheezing, cough, chest congestion. Advised take the Mucinex DM every 12 hours to help control cough and congestion.  Advised take ibuprofen or Motrin as needed for body aches, fever or chills.  Advised to follow-up PCP or return to urgent care if symptoms fail to improve.    ED Prescriptions     Medication Sig Dispense Auth. Provider   albuterol (VENTOLIN HFA) 108 (90 Base) MCG/ACT inhaler Inhale 2 puffs into the lungs every 6 (six) hours as needed for wheezing or shortness of breath. 8 g  Nyoka Lint, PA-C   Spacer/Aero-Holding Chambers DEVI 1 Units by Does not apply route in the morning, at noon, and at bedtime. 1 Units Nyoka Lint, PA-C   dextromethorphan-guaiFENesin [facility] DM) 30-600 MG 12hr tablet Take 1 tablet by mouth 2 (two) times daily. 20 tablet Nyoka Lint, PA-C      PDMP not reviewed this encounter.   Nyoka Lint, PA-C 02/08/23 1100

## 2024-06-17 ENCOUNTER — Telehealth: Admitting: Physician Assistant

## 2024-06-17 DIAGNOSIS — Z760 Encounter for issue of repeat prescription: Secondary | ICD-10-CM

## 2024-06-17 NOTE — Progress Notes (Signed)
 Virtual Visit Consent   Jo Ward, you are scheduled for a virtual visit with a [facility] provider today. Just as with appointments in the office, your consent must be obtained to participate. Your consent will be active for this visit and any virtual visit you may have with one of our providers in the next 365 days. If you have a MyChart account, a copy of this consent can be sent to you electronically.  As this is a virtual visit, video technology does not allow for your provider to perform a traditional examination. This may limit your provider's ability to fully assess your condition. If your provider identifies any concerns that need to be evaluated in person or the need to arrange testing (such as labs, EKG, etc.), we will make arrangements to do so. Although advances in technology are sophisticated, we cannot ensure that it will always work on either your end or our end. If the connection with a video visit is poor, the visit may have to be switched to a telephone visit. With either a video or telephone visit, we are not always able to ensure that we have a secure connection.  By engaging in this virtual visit, you consent to the provision of healthcare and authorize for your insurance to be billed (if applicable) for the services provided during this visit. Depending on your insurance coverage, you may receive a charge related to this service.  I need to obtain your verbal consent now. Are you willing to proceed with your visit today? Jo Ward has provided verbal consent on 06/17/2024 for a virtual visit (video or telephone). Teena Shuck, [place]  Date: 06/17/2024 5:54 PM   Virtual Visit via Video Note   I, Teena Shuck, connected with  Jo Ward  ([id number], Mar 06, 2001) on 06/17/24 at  6:00 PM EDT by a video-enabled telemedicine application and verified that I am speaking with the correct person using two identifiers.  Location: Patient: Virtual Visit Location Patient:  Home Provider: Virtual Visit Location Provider: Home Office   I discussed the limitations of evaluation and management by telemedicine and the availability of in person appointments. The patient expressed understanding and agreed to proceed.    History of Present Illness: Jo Ward is a 23 y.o. who identifies as a female who was assigned female at birth, and is being seen today for medication refill. Discontinued her meds during pregnancy and now would like to start them. Denies SI/HI or any other symptoms/concerns.   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    Allergies:  Allergies  Allergen Reactions   Lactose Intolerance (Gi) Other (See Comments)    REACTION:  GI upset   Other Other (See Comments)    REACTION:  GI upset   Medications:  Current Outpatient Medications:    acetaminophen  (TYLENOL ) 500 MG tablet, Take 1,000 mg by mouth every 6 (six) hours as needed for moderate pain (pain)., Disp: , Rfl:    albuterol  (VENTOLIN  HFA) 108 (90 Base) MCG/ACT inhaler, Inhale 2 puffs into the lungs every 6 (six)  hours as needed for wheezing or shortness of breath., Disp: 8 g, Rfl: 0   dextromethorphan-guaiFENesin  (MUCINEX  DM) 30-600 MG 12hr tablet, Take 1 tablet by mouth 2 (two) times daily., Disp: 20 tablet, Rfl: 0   Spacer/Aero-Holding Chambers DEVI, 1 Units by Does not apply route in the morning, at noon, and at bedtime., Disp: 1 Units, Rfl: 0  Observations/Objective: Patient is well-developed, well-nourished in no acute distress.  Resting  comfortably  at home.  Head is normocephalic, atraumatic.  No labored breathing.  Speech is clear and coherent with logical content.  Patient is alert and oriented at baseline.    Assessment and Plan: 1. Encounter for medication refill (Primary)  Patient without SI or HI. Presenting to restart her mental health meds after being off them for quite sometime. Counseled patient to follow up at [facility] health urgent care. I also advised her to follow up with her PCP. No acute or emergent conditions noted. All questions answered.   Follow Up Instructions: I discussed the assessment and treatment plan with the patient. The patient was provided an opportunity to ask questions and all were answered. The patient agreed with the plan and demonstrated an understanding of the instructions.  A copy of instructions were sent to the patient via MyChart unless otherwise noted below.     The patient was advised to call back or seek an in-person evaluation if the symptoms worsen or if the condition fails to improve as anticipated.    Teena Shuck, PA-C

## 2024-06-17 NOTE — Patient Instructions (Addendum)
  Jo Ward, thank you for joining Teena Shuck, PA-C for today's virtual visit.  While this provider is not your primary care provider (PCP), if your PCP is located in our provider database this encounter information will be shared with them immediately following your visit.   A [facility] MyChart account gives you access to today's visit and all your visits, tests, and labs performed at [facility]  click here if you don't have a [facility] MyChart account or go to [email or web address]  Consent: (Patient) Jo Ward provided verbal consent for this virtual visit at the beginning of the encounter.  Current Medications:  Current Outpatient Medications:    acetaminophen  (TYLENOL ) 500 MG tablet, Take 1,000 mg by mouth every 6 (six) hours as needed for moderate pain (pain)., Disp: , Rfl:    albuterol  (VENTOLIN  HFA) 108 (90 Base) MCG/ACT inhaler, Inhale 2 puffs into the lungs every 6 (six) hours as needed for wheezing or shortness of breath., Disp: 8 g, Rfl: 0   dextromethorphan-guaiFENesin  (MUCINEX  DM) 30-600 MG 12hr tablet, Take 1 tablet by mouth 2 (two) times daily., Disp: 20 tablet, Rfl: 0   Spacer/Aero-Holding Chambers DEVI, 1 Units by Does not apply route in the morning, at noon, and at bedtime., Disp: 1 Units, Rfl: 0   Medications ordered in this encounter:  No orders of the defined types were placed in this encounter.    *If you need refills on other medications prior to your next appointment, please contact your pharmacy*  Follow-Up: Call back or seek an in-person evaluation if the symptoms worsen or if the condition fails to improve as anticipated.  [facility] [phone]  Other Instructions Contact:  [facility] [address] [place], [place] [address] [phone]   If you have been instructed to have an in-person evaluation today at a local Urgent Care facility, please use the link below. It will take  you to a list of all of our available [facility], including address, phone number and hours of operation. Please do not delay care.  [facility]  If you or a family member do not have a primary care provider, use the link below to schedule a visit and establish care. When you choose a [facility] primary care physician or advanced practice provider, you gain a long-term partner in health. Find a Primary Care Provider  Learn more about [facility]'s in-office and virtual care options:  - Get Care Now

## 2024-06-29 ENCOUNTER — Ambulatory Visit (INDEPENDENT_AMBULATORY_CARE_PROVIDER_SITE_OTHER)

## 2024-06-29 VITALS — BP 109/73 | HR 79 | Temp 97.6°F | Ht 61.0 in | Wt 114.0 lb

## 2024-06-29 DIAGNOSIS — M792 Neuralgia and neuritis, unspecified: Secondary | ICD-10-CM | POA: Diagnosis not present

## 2024-06-29 DIAGNOSIS — F419 Anxiety disorder, unspecified: Secondary | ICD-10-CM | POA: Diagnosis not present

## 2024-06-29 DIAGNOSIS — Z0000 Encounter for general adult medical examination without abnormal findings: Secondary | ICD-10-CM | POA: Insufficient documentation

## 2024-06-29 DIAGNOSIS — N920 Excessive and frequent menstruation with regular cycle: Secondary | ICD-10-CM | POA: Insufficient documentation

## 2024-06-29 DIAGNOSIS — F3181 Bipolar II disorder: Secondary | ICD-10-CM | POA: Diagnosis not present

## 2024-06-29 DIAGNOSIS — F32A Depression, unspecified: Secondary | ICD-10-CM | POA: Insufficient documentation

## 2024-06-29 MED ORDER — NAPROXEN 500 MG PO TABS: 500.0000 mg | ORAL_TABLET | Freq: Two times a day (BID) | ORAL | 3 refills | Status: AC

## 2024-06-29 MED ORDER — FAMOTIDINE 20 MG PO TABS: 20.0000 mg | ORAL_TABLET | Freq: Two times a day (BID) | ORAL | 3 refills | Status: AC

## 2024-06-29 MED ORDER — LO LOESTRIN FE 1 MG-10 MCG / 10 MCG PO TABS: 1.0000 | ORAL_TABLET | Freq: Every day | ORAL | 11 refills | Status: AC

## 2024-06-29 NOTE — Patient Instructions (Addendum)
 It was nice to see you today!  As we discussed in clinic:  - I have sent in a refill of your naproxen  500 mg to take twice daily with food. -I have also sent in famotidine  20 mg to take twice daily as needed for GERD. -Lastly, I sent in low Loestrin  FE, which is a low-dose birth control with an iron supplement added to it.  As discussed, it is normal for your periods to become irregular for 4 to 6 months after starting this medication.  Please make sure to take this medication around the same time every day to avoid breakthrough bleeding.  -I recommend setting up an OB/GYN Appointment for pap smear at [facility]. Their phone number is  [phone] -I have also placed the referrals to psychiatry and to psychology for therapy. They will call you to get these appointments scheduled. -We are also going to plan for fasting blood work either this week or next week and I will be in touch with you regarding the results + plan!   I will see you back in 6 months for a follow up! It was nice to meet you!  If you have any problems before your next visit feel free to message me via MyChart (minor issues or questions) or call the office, otherwise you may reach out to schedule an office visit.  Thank you! Saddie Sacks, PA-C

## 2024-06-29 NOTE — Progress Notes (Signed)
 New Patient Office Visit  Subjective    Patient ID: Jo Ward, female    DOB: December 27, 2000  Age: 23 y.o. MRN: [id number]  CC:  Chief Complaint  Patient presents with   New Patient (Initial Visit)    HPI IYONA Ward presents to establish care. She is a new patient to the practice.   Patient not currently taking any medications. Reports that in the past she was taking several medications for anxiety/depression but she cannot recall the medication/dosages. She would like to see psychiatry before restarting any of these medications due to her combination of psych diagnoses.   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 [name] has hx of bilateral knee MPFL repair by Dr. Veverly. Right knee done: 03/03/15 Left knee done: 06/30/15. She only completed 5 PT visits after her first surgery and 8 after her second. Reports that since these surgeries, she has been left with lower extremity neuropathy and numbness in the legs. She has a handicap placard. Reports that she stopped following up with orthopedics because they would only offer her exploratory surgery and she was not interested in having any more procedures done. Controls the pain with Naproxen  OTC but is requesting that it be refilled so she does not have to keep buying it OTC. Reports that the naproxen  keeps her pain well controlled.   Has never had a Pap smear. Reports she used to see an OB/GYN who diagnosed her PCOS and endometriosis, however she was lost to follow up. Would like to re-establish with an OB/GYN within the [facility] for pap smears and continued management of her GYN conditions. Reports that she is not currently on birth control. Periods are regular but very heavy. Lasting anywhere from 7-8 days at a time and reports very heavy bleeding with clots. Endorses accompanied easy bruising and recent fatigue. Denies bleeding gums or epistaxis. Would like to discuss oral birth control options to regulate periods. Patient has been on oral  birth control as well as implant/injections in the past but did not like the implant/injections since they caused a lot of weight gain. Not currently on any birth control. Not sexually active. LMP: 06/23/24.   PMH: Anxiety/depression/Bipolar II, endometriosis/PCOS, GERD   Tobacco use: Denies Alcohol use: Denies Drug use: Marijuana use (occasional, once per week) Marital status: Single Employment: Management/Admin for cleaning business Sexual hx: Not currently sexually active. LMP: 06/23/24  Screenings:  Colon Cancer: N/A; denies family history Lung Cancer: N/A Breast Cancer: N/A; Reports family history in mother (diagnosed late 85's) Diabetes: Getting A1c with labs today HLD: Getting lipid panel with labs today   Outpatient Encounter Medications as of 06/29/2024  Medication Sig   famotidine  (PEPCID ) 20 MG tablet Take 1 tablet (20 mg total) by mouth 2 (two) times daily.   naproxen  (NAPROSYN ) 500 MG tablet Take 1 tablet (500 mg total) by mouth 2 (two) times daily with a meal.   Norethindrone-Ethinyl Estradiol-Fe Biphas (LO LOESTRIN FE ) 1 MG-10 MCG / 10 MCG tablet Take 1 tablet by mouth daily.   dextromethorphan-guaiFENesin  (MUCINEX  DM) 30-600 MG 12hr tablet Take 1 tablet by mouth 2 (two) times daily. (Patient not taking: Reported on 06/29/2024)   [DISCONTINUED] acetaminophen  (TYLENOL ) 500 MG tablet Take 1,000 mg by mouth every 6 (six) hours as needed for moderate pain (pain). (Patient not taking: Reported on 06/29/2024)   [DISCONTINUED] albuterol  (VENTOLIN  HFA) 108 (90 Base) MCG/ACT inhaler Inhale 2 puffs into the lungs every 6 (six) hours as needed for wheezing  or shortness of breath. (Patient not taking: Reported on 06/29/2024)   [DISCONTINUED] promethazine  (PHENERGAN ) 12.5 MG tablet    [DISCONTINUED] Spacer/Aero-Holding Chambers DEVI 1 Units by Does not apply route in the morning, at noon, and at bedtime. (Patient not taking: Reported on 06/29/2024)   No facility-administered encounter  medications on file as of 06/29/2024.    Past Medical History:  Diagnosis Date   Heart murmur    Hip fracture (HCC)    Neck fracture (HCC)    Premature birth    Scoliosis    Seasonal allergies    Shoulder dislocation    Wrist fracture     Past Surgical History:  Procedure Laterality Date   KNEE SURGERY     NPFL on left and right and meniscus repair to left knee (total of 3 knee surgeries) per mother and patient   TONSILLECTOMY     TONSILLECTOMY AND ADENOIDECTOMY      Family History  Problem Relation Age of Onset   Cancer Mother    Hypertension Mother     Social History   Socioeconomic History   Marital status: Single    Spouse name: Not on file   Number of children: Not on file   Years of education: Not on file   Highest education level: Some college, no degree  Occupational History   Occupation: Electronics engineer  Tobacco Use   Smoking status: Never   Smokeless tobacco: Never  Substance and Sexual Activity   Alcohol use: No   Drug use: No   Sexual activity: Yes    Birth control/protection: None  Other Topics Concern   Not on file  Social History Narrative   Not on file   Social Drivers of Health   Financial Resource Strain: Low Risk  (06/29/2024)   Overall Financial Resource Strain (CARDIA)    Difficulty of Paying Living Expenses: Not very hard  Food Insecurity: No Food Insecurity (06/29/2024)   Hunger Vital Sign    Worried About Running Out of Food in the Last Year: Never true    Ran Out of Food in the Last Year: Never true  Transportation Needs: No Transportation Needs (06/29/2024)   PRAPARE - Administrator, Civil Service (Medical): No    Lack of Transportation (Non-Medical): No  Physical Activity: Sufficiently Active (06/29/2024)   Exercise Vital Sign    Days of Exercise per Week: 7 days    Minutes of Exercise per Session: 90 min  Stress: No Stress Concern Present (06/29/2024)   [employer] of  Occupational Health - Occupational Stress Questionnaire    Feeling of Stress: Only a little  Social Connections: Unknown (06/29/2024)   Social Connection and Isolation Panel    Frequency of Communication with Friends and Family: Twice a week    Frequency of Social Gatherings with Friends and Family: Once a week    Attends Religious Services: Never    Database administrator or Organizations: No    Attends Engineer, structural: Not on file    Marital Status: Patient declined  Intimate Partner Violence: Not on file    ROS   Per HPI   Objective    BP 109/73   Pulse 79   Temp 97.6 F (36.4 C) (Oral)   Ht 5' 1 (1.549 m)   Wt 114 lb 0.6 oz (51.7 kg)   LMP 06/19/2024   SpO2 98%   BMI 21.55 kg/m   Physical Exam Constitutional:  General: She is not in acute distress.    Appearance: Normal appearance.  HENT:     Right Ear: Tympanic membrane normal.     Left Ear: Tympanic membrane normal.     Mouth/Throat:     Mouth: Mucous membranes are moist.     Pharynx: Oropharynx is clear.  Eyes:     Pupils: Pupils are equal, round, and reactive to light.  Cardiovascular:     Rate and Rhythm: Normal rate and regular rhythm.     Heart sounds: Normal heart sounds. No murmur heard.    No friction rub. No gallop.  Pulmonary:     Effort: Pulmonary effort is normal. No respiratory distress.     Breath sounds: Normal breath sounds.  Abdominal:     General: Abdomen is flat. Bowel sounds are normal.     Palpations: Abdomen is soft.  Musculoskeletal:        General: No swelling.     Cervical back: Neck supple.  Lymphadenopathy:     Cervical: No cervical adenopathy.  Skin:    General: Skin is warm and dry.     Findings: Bruising (Small, scattered bruises noted to bilateral lower legs. Large bruise noted to superior left arm 2/2 running into something.) present.  Neurological:     General: No focal deficit present.     Mental Status: She is alert.  Psychiatric:        Mood  and Affect: Mood normal.        Behavior: Behavior normal.        Thought Content: Thought content normal.       Assessment & Plan:   Anxiety and depression Assessment & Plan: PHQ-2 score 0. Not currently on any medications. Reports she was also previously diagnosed with bipolar II. Requesting referrals to psychiatry for medication management and to psychology for therapy. Patient studied psychology in college and would like to be evaluated for BPD.  Orders: -     Ambulatory referral to Psychology -     Ambulatory referral to Psychiatry  Bipolar II disorder [facility]) -     Ambulatory referral to Psychology -     Ambulatory referral to Psychiatry  Nerve pain Assessment & Plan: Residual from her knee surgeries in 2016. Previously on gabapentin but quit taking it several years ago because it was not helping. Managing with natural supplements and Naproxen  as needed. Nerve conduction study in the past was unremarkable. Refill for Naproxen  500 mg BID PRN sent to her pharmacy.    Menorrhagia with regular cycle Assessment & Plan: Given concurrent bruising + complaints of fatigue, will check CBC, iron panel with other fasting labs. Will treat deficiencies as indicated. Start Lo Loestrin Fe  OCP daily for better regulation of menstrual cycles/decreased bleeding. Will follow up with patient in 6 months to reassess symptoms and see how she is doing on the OCP.   Encounter for medical examination to establish care Assessment & Plan: Have ordered fasting labs on the patient (see orders). Will follow up with results. Discussed age appropriate health screenings including pap smear. Phone number provided to establish with [facility] per patient request. Vaccines declined today. Encouraged her to continue eating a well balanced diet that prioritizes protein and fiber intake. Daily exercise as tolerated given her knee injuries. Will plan for next CPE in 1 year, follow up on OCP in 6  months, sooner PRN. Patient verbalized understanding and was in agreement with the plan.   Other orders -  Lo Loestrin Fe ; Take 1 tablet by mouth daily.  Dispense: 28 tablet; Refill: 11 -     Naproxen ; Take 1 tablet (500 mg total) by mouth 2 (two) times daily with a meal.  Dispense: 60 tablet; Refill: 3 -     Famotidine ; Take 1 tablet (20 mg total) by mouth 2 (two) times daily.  Dispense: 60 tablet; Refill: 3   I spent approximately 60 minutes reviewing charts from multiple sources and specialists, review of recent labs and imaging studies, discussing both acute and chronic conditions with patient, and making changes to patients prescription management.   Return in about 6 months (around 12/30/2024) for Birth control/menstrual cycles, mood.   Saddie JULIANNA Sacks, PA-C

## 2024-06-29 NOTE — Assessment & Plan Note (Signed)
 PHQ-2 score 0. Not currently on any medications. Reports she was also previously diagnosed with bipolar II. Requesting referrals to psychiatry for medication management and to psychology for therapy. Patient studied psychology in college and would like to be evaluated for BPD.

## 2024-06-29 NOTE — Assessment & Plan Note (Signed)
 Have ordered fasting labs on the patient (see orders). Will follow up with results. Discussed age appropriate health screenings including pap smear. Phone number provided to establish with [facility] per patient request. Vaccines declined today. Encouraged her to continue eating a well balanced diet that prioritizes protein and fiber intake. Daily exercise as tolerated given her knee injuries. Will plan for next CPE in 1 year, follow up on OCP in 6 months, sooner PRN. Patient verbalized understanding and was in agreement with the plan.

## 2024-06-29 NOTE — Assessment & Plan Note (Signed)
 Given concurrent bruising + complaints of fatigue, will check CBC, iron panel with other fasting labs. Will treat deficiencies as indicated. Start Lo Loestrin Fe  OCP daily for better regulation of menstrual cycles/decreased bleeding. Will follow up with patient in 6 months to reassess symptoms and see how she is doing on the OCP.

## 2024-06-29 NOTE — Assessment & Plan Note (Signed)
 Residual from her knee surgeries in 2016. Previously on gabapentin but quit taking it several years ago because it was not helping. Managing with natural supplements and Naproxen  as needed. Nerve conduction study in the past was unremarkable. Refill for Naproxen  500 mg BID PRN sent to her pharmacy.

## 2024-07-06 ENCOUNTER — Other Ambulatory Visit: Payer: Self-pay

## 2024-07-06 DIAGNOSIS — Z1159 Encounter for screening for other viral diseases: Secondary | ICD-10-CM

## 2024-07-06 DIAGNOSIS — N920 Excessive and frequent menstruation with regular cycle: Secondary | ICD-10-CM

## 2024-07-06 DIAGNOSIS — Z130 Encounter for screening for diseases of the blood and blood-forming organs and certain disorders involving the immune mechanism: Secondary | ICD-10-CM

## 2024-07-09 ENCOUNTER — Other Ambulatory Visit

## 2024-08-07 ENCOUNTER — Ambulatory Visit

## 2024-08-20 ENCOUNTER — Ambulatory Visit (INDEPENDENT_AMBULATORY_CARE_PROVIDER_SITE_OTHER): Admitting: Clinical

## 2024-08-20 DIAGNOSIS — F411 Generalized anxiety disorder: Secondary | ICD-10-CM | POA: Diagnosis not present

## 2024-08-20 NOTE — Progress Notes (Unsigned)
 [facility] Counselor Initial Adult Exam  Name: Jo Ward Date: 08/20/2024 MRN: [id number] DOB: 18-Feb-2001 PCP: Gayle Saddie FALCON, PA-C  Time spent: 12:40pm - 1:21pm   Guardian/Payee:  NA    Paperwork requested: NA  Reason for Visit /Presenting Problem: Patient stated, I use to go to therapy when I was younger here and it stopped about at 38. 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   Mental Status Exam: Appearance:   Neat and Well Groomed     Behavior:  Appropriate  Motor:  Normal  Speech/Language:   Clear and Coherent and Normal Rate  Affect:  Tearful  Mood:  anxious  Thought process:  normal  Thought content:    WNL  Sensory/Perceptual disturbances:    WNL  Orientation:  oriented to person, place, situation, day of week, month of year, and year  Attention:  Good  Concentration:  Good  Memory:  WNL  Fund of knowledge:   Good  Insight:    Good  Judgment:   Good  Impulse Control:  Good   Reported Symptoms:  Patient stated, really quick fuse, anger outbursts (quick, very snappy, I black out sometimes, I've been told it is very aggressive, cursing), think to myself, couple hours, what I call happy hour (I may want to clean up more, increased energy (a little), history of pressured speech and increases when in a group setting or anxious, anxiety, irritability when anxious, anger issues, difficulty staying asleep, decreased appetite after relationship ended, decreased concentration a little bit this week, forgetfulness, easily distracted when alone, my mind drifts when alone, moves to another task prior to completing task.   Risk Assessment: Danger to Self:  No Patient denied current and past suicidal ideation. Patient denied current and past symptoms of psychosis Self-injurious Behavior: No Patient reported a history of cutting at age [age] and has hit herself  on thigh/head when angry Danger to Others: No Patient denied current and past homicidal ideation Duty to Warn:no Physical Aggression / Violence:No   Access to Firearms a concern: No current concern but reported access Gang Involvement:No  Patient / guardian was educated about steps to take if suicide or homicide risk level increases between visits: yes While future psychiatric events cannot be accurately predicted, the patient does not currently require acute inpatient psychiatric care and does not currently meet [place]  involuntary commitment criteria.  Substance Abuse History: Current substance abuse: Patient reported current marijuana use of 1 gram daily, to sleep and for pain, with last use yesterday. Patient reported no current or past tobacco use. Patient reported no additional substance use.      Past Psychiatric History:   Previous psychological history is significant for personality disorder and bipolar disorder Outpatient Providers:Patient reported a history of grief counseling at age [age] in [place], history of individual therapy with a provider at [facility], history of medication management History of Psych Hospitalization: No  Psychological Testing: none   Abuse History:  Victim of: No., none   Report needed: No. Victim of Neglect:No. Perpetrator of none  Witness / Exposure to Domestic Violence: No   Protective Services Involvement: No  Witness to [employer] Violence:  No   Family History:  Family History  Problem Relation Age of Onset   Cancer Mother    Hypertension Mother     Living situation: the patient lives with their family (  mother, uncle, patient  Sexual Orientation: Straight  Relationship Status: in a relationship   Name of spouse / other: Inge Primes If a parent, number of children / ages: 0  Support Systems: significant other Mother, father  Financial Stress:  No   Income/Employment/Disability:  Employment  Financial planner: No   Educational History: Education: some college  Oncologist: none  Any cultural differences that may affect / interfere with treatment:  not applicable   Recreation/Hobbies: Primary school teacher  Stressors: Occupational concerns   Other: relationship stressors,  father cheated on mother 4 years ago and moved out, dynamics between parents  Strengths: Patient stated, ill try to talk myself down, Ill try to figure out how to work it out, reaches out to supports  Barriers:  none   Legal History: Pending legal issue / charges: The patient has no significant history of legal issues. History of legal issue / charges: none  Medical History/Surgical History: reviewed Past Medical History:  Diagnosis Date   Heart murmur    Hip fracture (HCC)    Neck fracture (HCC)    Premature birth    Scoliosis    Seasonal allergies    Shoulder dislocation    Wrist fracture     Past Surgical History:  Procedure Laterality Date   KNEE SURGERY     NPFL on left and right and meniscus repair to left knee (total of 3 knee surgeries) per mother and patient   TONSILLECTOMY     TONSILLECTOMY AND ADENOIDECTOMY      Medications: Current Outpatient Medications  Medication Sig Dispense Refill   dextromethorphan-guaiFENesin  (MUCINEX  DM) 30-600 MG 12hr tablet Take 1 tablet by mouth 2 (two) times daily. (Patient not taking: Reported on 06/29/2024) 20 tablet 0   famotidine  (PEPCID ) 20 MG tablet Take 1 tablet (20 mg total) by mouth 2 (two) times daily. 60 tablet 3   naproxen  (NAPROSYN ) 500 MG tablet Take 1 tablet (500 mg total) by mouth 2 (two) times daily with a meal. 60 tablet 3   Norethindrone-Ethinyl Estradiol-Fe Biphas (LO LOESTRIN FE ) 1 MG-10 MCG / 10 MCG tablet Take 1 tablet by mouth daily. 28 tablet 11   No current facility-administered medications for this visit.  Patient reported not currently taking lo loestrin FE   Allergies  Allergen  Reactions   Lactose Intolerance (Gi) Other (See Comments)    REACTION:  GI upset   Other Other (See Comments)    REACTION:  GI upset  Patient reported patient has a reaction to pineapple  Diagnoses:  Generalized anxiety disorder R/O Bipolar Disorder R/O Borderline Personality Disorder  Plan of Care: Patient is a 23 year old female who presented for an initial assessment. Clinician conducted initial assessment in person from clinician's office at [facility]. Patient reported the following symptoms: really quick fuse, anger outbursts, I may want to clean up more, increased energy, history of pressured speech and increases when in a group setting or anxious, anxiety, irritability when anxious, difficulty staying asleep, decreased appetite after relationship ended, decreased concentration, forgetfulness, easily distracted when alone, my mind drifts when alone, moves to another task prior to completing task. Patient denied current and past suicidal ideation, homicidal ideation, and symptoms of psychosis. Patient reported current marijuana use daily with last use yesterday. Patient reported no current or past tobacco use. Patient reported no additional substance use.  Patient reported a history of participation in outpatient therapy. Patient reported no history of psychiatric hospitalizations. Patient reported the following stressors: occupational concerns, relationship  stressors, dynamics between patient's parents. Patient identified patient's significant other, mother, and father as patient's support system. It is recommended patient be referred to a psychiatrist for a medication management consult and recommended patient participate in individual therapy biweekly. Clinician will review recommendations and treatment plan with patient during follow up appointment. Treatment plan will be developed during follow up appointment.     Collaboration of Care: Other Patient declined consents at this  time  Patient/Guardian was advised Release of Information must be obtained prior to any record release in order to collaborate their care with an outside provider. Patient/Guardian was advised if they have not already done so to contact [facility] to sign all necessary forms in order for us  to release information regarding their care.   Consent: Patient/Guardian gives written consent for treatment and assignment of benefits for services provided during this visit. Patient/Guardian expressed understanding and agreed to proceed.   Darice Seats, LCSW

## 2024-08-20 NOTE — Progress Notes (Unsigned)
   Darice Seats, LCSW

## 2024-09-16 ENCOUNTER — Ambulatory Visit: Admitting: Clinical

## 2024-10-14 ENCOUNTER — Ambulatory Visit (INDEPENDENT_AMBULATORY_CARE_PROVIDER_SITE_OTHER): Admitting: Clinical

## 2024-10-14 DIAGNOSIS — F411 Generalized anxiety disorder: Secondary | ICD-10-CM

## 2024-10-14 NOTE — Progress Notes (Signed)
 [facility] Counselor/Therapist Progress Note  Patient ID: Jo Ward, MRN: [id number]    Date: 10/14/24  Time Spent: 8:36  am - 9:21 am : 45 Minutes  Treatment Type: Individual Therapy.  Reported Symptoms: anxiety, anger/irritability at times, difficulty organizing, difficulty waiting patient's turn in conversations/blurting out, feelings of panic at times  Mental Status Exam: Appearance:  Neat and Well Groomed     Behavior: Appropriate  Motor: Normal  Speech/Language:  Clear and Coherent and Normal Rate  Affect: Appropriate  Mood: normal  Thought process: normal  Thought content:   WNL  Sensory/Perceptual disturbances:   WNL  Orientation: oriented to person, place, time/date, and situation  Attention: Good  Concentration: Good  Memory: WNL  Fund of knowledge:  Good  Insight:   Good  Judgment:  Good  Impulse Control: Good   Risk Assessment: Danger to Self:  No Patient denied current suicidal ideation  Self-injurious Behavior: No Danger to Others: No Patient denied current homicidal ideation Duty to Warn:no Physical Aggression / Violence:No  Access to Firearms a concern: No  Gang Involvement:No   Subjective:  Patient stated, pretty good in response to events since last session and reported no changes. Patient reported a lot better, I'm more calm but my anxiety is still there in response to patient's mood since last session. Patient reported good in responses to mood today. Patient reported a family history of Autism spectrum disorder (uncle) and patient reported anxiety in response to uncle's behaviors at times. Patient reported patient's family has referred to patient's father as narcissistic. Patient reported a historical diagnosis of Bipolar I Disorder and reported Borderline Personality Disorder was discussed during previous treatment. Patient reported a history of hyperactivity, difficulty completing assignments, completing assignments after the  deadline or waiting until the last minute to complete assignments, delaying assignments. Patient stated, I don't know what that is in response to organization. Patient reported a history of difficulty sitting still in class, difficulty waiting patient's turn in conversations and blurts out in conversations, impulsivity. Patient reported patient is open to ADHD evaluation. Patient inquired about couples counseling due to conflict in patient's relationship with significant other. Patient reported I want to try it again in response to consultation with a psychiatrist. Patient stated, that works with me in response to recommendation for therapy. Patient stated, not 100% yet, I'll have to think about that in response to goals for therapy.   Interventions: Motivational Interviewing. Clinician conducted session in person at clinician's office at [facility]. Reviewed events since last session and assessed for changes. Clinician reviewed diagnosis and treatment recommendations. Provided psycho education related to diagnosis and treatment. Gathered additional information related to symptoms and patient's history. Discussed recommendation for an ADHD evaluation. Provided psycho education related to symptoms of Bipolar Disorder, ADHD, and Borderline Personality Disorder. Clinician utilized motivational interviewing to explore potential goals for therapy. Clinician requested for homework patient develop a list of potential goals for therapy.   Collaboration of Care: Psychiatrist AEB Discussed consent required for referral to psychiatrist  Patient/Guardian was advised Release of Information must be obtained prior to any record release in order to collaborate their care with an outside provider. Patient/Guardian was advised if they have not already done so to contact [facility] to sign all necessary forms in order for us  to release information regarding their care.   Consent:  Patient/Guardian gives verbal consent for treatment and assignment of benefits for services provided during this visit. Patient/Guardian expressed understanding and agreed  to proceed.   Diagnosis:  Generalized anxiety disorder R/O Bipolar Disorder and ADHD   Plan: Goals to be determined during follow up appointment on 11/18/24 due to patient experiencing difficulty identifying goals for therapy.                  Darice Seats, LCSW

## 2024-11-18 ENCOUNTER — Ambulatory Visit: Admitting: Clinical

## 2024-12-01 ENCOUNTER — Ambulatory Visit: Admitting: Clinical

## 2024-12-01 DIAGNOSIS — F411 Generalized anxiety disorder: Secondary | ICD-10-CM | POA: Diagnosis not present

## 2024-12-01 NOTE — Progress Notes (Signed)
 [name] Counselor/Therapist Progress Note  Patient ID: Jo Ward, MRN: [id number],    Date: 12/01/2024  Time Spent: 9:37am - 10:21am : 44 minutes  Treatment Type: Individual Therapy  Reported Symptoms: difficulty staying asleep, anxiety, worry  Mental Status Exam: Appearance:  Neat and Well Groomed     Behavior: Appropriate  Motor: Normal  Speech/Language:  Clear and Coherent and Normal Rate  Affect: Appropriate  Mood: Patient stated, pretty good in response to mood.   Thought process: normal  Thought content:   WNL  Sensory/Perceptual disturbances:   WNL  Orientation: oriented to person, place, time/date, and situation  Attention: Good  Concentration: Good  Memory: WNL  Fund of knowledge:  Good  Insight:   Good  Judgment:  Good  Impulse Control: Good   Risk Assessment: Danger to Self:  No Patient denied current suicidal ideation  Self-injurious Behavior: No Danger to Others: No Patient denied current homicidal ideation Duty to Warn:no Physical Aggression / Violence:No  Access to Firearms a concern: No  Gang Involvement:No   Subjective: Patient stated, yes and no in response to changes since last session. Patient reported patient and significant other ended their relationship since last session. Patient reported the loss of two classmates and four classmates have been incarcerated since last session. Patient stated, I'm fine with it in reference to relationship ending. Patient stated, I haven't been as angry but my anxiety level has been up a lot more and reported worry. Patient reported patient obtained a second job since last session. Patient reported be better with my anxiety and talk about it in response to goals for therapy.   Interventions: Motivational Interviewing. Clinician conducted session in person at clinician's office at [facility]. Assessed for changes since last session. Clinician utilized motivational interviewing  to explore potential goals for therapy. Clinician utilized a task centered approach in collaboration with patient to develop treatment plan. Patient participated in development of treatment plan and verbally agreed to treatment plan.  Collaboration of Care: Other not required at this time  Diagnosis:Generalized anxiety disorder  Plan: Behavioral Health Treatment Plan   Name:Jo Ward  MRN: [id number]   Treatment Plan Development Date: 12/01/24   Strengths: Family, Friends, and Other (self care)  Supports: Friends and Parents   Client Statement of Needs: At the time of initial assessment patient stated, I use to go to therapy when I was younger here and it stopped about at 52. 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    Treatment Level: outpatient therapy  Client Treatment Preferences: in person appointments, morning appointments   Diagnosis: Anxiety  Generalized Anxiety Disorder  Symptoms:  Excessive and/or unrealistic worry that is difficult to control occurring more days than not for at least 6 months about a number of events or activities, Difficulty managing worry, Restlessness or feeling keyed up or on edge, Difficulty concentrating or mind going blank, Irritability, Sleep disturbance (Difficulty falling or staying asleep, restlessness, or unsatisfying sleep, and anger  Goals:  Improve daily functioning by reducing overall anxiety symptoms including intensity, frequency, and duration of symptoms., Build and employ tools and skills to reduce symptoms of anxiety, worry, and improve functioning day-to-day., and Address negative cognitions, distortions, and behaviors that contribute to anxiety symptoms and affect overall functioning.  Objectives: Target Date For All Objectives: 12/01/25  Meet with psychiatric provider,  complete evaluation, and discuss  psychotropic medication., Develop coping tools to manage anxiety including mindfulness, acceptance, relaxation, reframing, and challenging negative thoughts and feelings., Identify, challenge, and manage negative self-talk, negative thinking about self and others, and maintain mindfulness regarding self-talk and cognitive distortions., Develop consistent self-care routine including exercise, healthful eating, consistent sleep., Identify contributing factors to anxiety including previous or current situations., and Maintain mindfulness of symptoms and develop protocol to address symptoms to prevent relapse.  Progress Documentation:  Established 12/01/24  Interventions:  CBT - reframing, challenging, cognitive restructuring, Relaxation and mindfulness, Self-Care - exercise, sleep, nutrition, and Referral to psychiatrist    Expected duration of treatment: 12 months  Party responsible for implementation of interventions: Darice Seats, MSW, LCSW, therapist, and Lacinda Finder, patient.   This plan has been reviewed and created by the following participants: Darice Seats, MSW, LCSW, therapist, and Lacinda Finder, patient.   A new plan will be created at least every 12 months.  The patient fully participated in the development of treatment plan with the clinician and verbally consents to such treatment.   Patient Treatment Plan Signature Obtained: Yes, please see shared S: [address] for sign off.    Darice Seats, LCSW

## 2024-12-01 NOTE — Progress Notes (Signed)
   Darice Seats, LCSW

## 2024-12-01 NOTE — Progress Notes (Signed)
 Behavioral Health Treatment Plan   Name:Jo Ward  MRN: [id number]   Treatment Plan Development Date: 12/01/24   Strengths: Family, Friends, and Other (self care)  Supports: Friends and Parents   Client Statement of Needs: At the time of initial assessment patient stated, I use to go to therapy when I was younger here and it stopped about at 34. 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    Treatment Level: outpatient therapy  Client Treatment Preferences: in person appointments, morning appointments   Diagnosis: Anxiety  Generalized Anxiety Disorder  Symptoms:  Excessive and/or unrealistic worry that is difficult to control occurring more days than not for at least 6 months about a number of events or activities, Difficulty managing worry, Restlessness or feeling keyed up or on edge, Difficulty concentrating or mind going blank, Irritability, Sleep disturbance (Difficulty falling or staying asleep, restlessness, or unsatisfying sleep, and anger  Goals:  Improve daily functioning by reducing overall anxiety symptoms including intensity, frequency, and duration of symptoms., Build and employ tools and skills to reduce symptoms of anxiety, worry, and improve functioning day-to-day., and Address negative cognitions, distortions, and behaviors that contribute to anxiety symptoms and affect overall functioning.  Objectives: Target Date For All Objectives: 12/01/25  Meet with psychiatric provider, complete evaluation, and discuss psychotropic medication., Develop coping tools to manage anxiety including mindfulness, acceptance, relaxation, reframing, and challenging negative thoughts and feelings., Identify, challenge, and manage negative self-talk, negative thinking about self and others, and maintain mindfulness regarding self-talk and cognitive distortions., Develop  consistent self-care routine including exercise, healthful eating, consistent sleep., Identify contributing factors to anxiety including previous or current situations., and Maintain mindfulness of symptoms and develop protocol to address symptoms to prevent relapse.  Progress Documentation:  Established 12/01/24  Interventions:  CBT - reframing, challenging, cognitive restructuring, Relaxation and mindfulness, Self-Care - exercise, sleep, nutrition, and Referral to psychiatrist    Expected duration of treatment: 12 months  Party responsible for implementation of interventions: Darice Seats, MSW, LCSW, therapist, and Jo Ward, patient.   This plan has been reviewed and created by the following participants: Darice Seats, MSW, LCSW, therapist, and Jo Ward, patient.   A new plan will be created at least every 12 months.  The patient fully participated in the development of treatment plan with the clinician and verbally consents to such treatment.   Patient Treatment Plan Signature Obtained: Yes, please see shared S: [address] for sign off.                  Darice Seats, LCSW

## 2024-12-30 ENCOUNTER — Ambulatory Visit

## 2025-01-13 ENCOUNTER — Ambulatory Visit: Admitting: Clinical
